# Patient Record
Sex: Female | Born: 1945 | Race: White | Hispanic: No | Marital: Married | Smoking: Never smoker
Health system: Southern US, Community
[De-identification: ages and names within clinical notes are randomized; demographics above are authoritative.]

## PROBLEM LIST (undated history)

## (undated) DIAGNOSIS — M1990 Unspecified osteoarthritis, unspecified site: Secondary | ICD-10-CM

## (undated) DIAGNOSIS — I10 Essential (primary) hypertension: Secondary | ICD-10-CM

## (undated) DIAGNOSIS — A64 Unspecified sexually transmitted disease: Secondary | ICD-10-CM

## (undated) HISTORY — PX: TUBAL LIGATION: SHX77

## (undated) HISTORY — DX: Unspecified sexually transmitted disease: A64

## (undated) HISTORY — DX: Unspecified osteoarthritis, unspecified site: M19.90

## (undated) HISTORY — DX: Essential (primary) hypertension: I10

---

## 1985-02-03 HISTORY — PX: BREAST EXCISIONAL BIOPSY: SUR124

## 1985-02-03 HISTORY — PX: BREAST SURGERY: SHX581

## 1989-02-03 HISTORY — PX: THYROIDECTOMY: SHX17

## 1995-02-04 HISTORY — PX: OTHER SURGICAL HISTORY: SHX169

## 1997-05-30 ENCOUNTER — Other Ambulatory Visit: Admission: RE | Admit: 1997-05-30 | Discharge: 1997-05-30 | Payer: Self-pay

## 1997-06-09 ENCOUNTER — Ambulatory Visit (HOSPITAL_COMMUNITY): Admission: RE | Admit: 1997-06-09 | Discharge: 1997-06-09 | Payer: Self-pay | Admitting: Gastroenterology

## 1997-11-13 ENCOUNTER — Other Ambulatory Visit: Admission: RE | Admit: 1997-11-13 | Discharge: 1997-11-13 | Payer: Self-pay | Admitting: Obstetrics and Gynecology

## 1997-11-21 ENCOUNTER — Ambulatory Visit (HOSPITAL_COMMUNITY): Admission: RE | Admit: 1997-11-21 | Discharge: 1997-11-21 | Payer: Self-pay | Admitting: Obstetrics and Gynecology

## 1998-11-20 ENCOUNTER — Other Ambulatory Visit: Admission: RE | Admit: 1998-11-20 | Discharge: 1998-11-20 | Payer: Self-pay | Admitting: Obstetrics and Gynecology

## 1999-01-07 ENCOUNTER — Encounter: Payer: Self-pay | Admitting: Obstetrics and Gynecology

## 1999-01-07 ENCOUNTER — Ambulatory Visit (HOSPITAL_COMMUNITY): Admission: RE | Admit: 1999-01-07 | Discharge: 1999-01-07 | Payer: Self-pay | Admitting: Obstetrics and Gynecology

## 1999-02-26 ENCOUNTER — Emergency Department (HOSPITAL_COMMUNITY): Admission: EM | Admit: 1999-02-26 | Discharge: 1999-02-26 | Payer: Self-pay | Admitting: Internal Medicine

## 1999-11-20 ENCOUNTER — Other Ambulatory Visit: Admission: RE | Admit: 1999-11-20 | Discharge: 1999-11-20 | Payer: Self-pay | Admitting: Obstetrics and Gynecology

## 2000-01-14 ENCOUNTER — Encounter: Payer: Self-pay | Admitting: Obstetrics and Gynecology

## 2000-01-14 ENCOUNTER — Ambulatory Visit (HOSPITAL_COMMUNITY): Admission: RE | Admit: 2000-01-14 | Discharge: 2000-01-14 | Payer: Self-pay | Admitting: Obstetrics and Gynecology

## 2000-05-19 ENCOUNTER — Ambulatory Visit (HOSPITAL_COMMUNITY): Admission: RE | Admit: 2000-05-19 | Discharge: 2000-05-19 | Payer: Self-pay | Admitting: Gastroenterology

## 2000-05-19 ENCOUNTER — Encounter: Admission: RE | Admit: 2000-05-19 | Discharge: 2000-05-19 | Payer: Self-pay | Admitting: Gastroenterology

## 2000-05-19 ENCOUNTER — Encounter: Payer: Self-pay | Admitting: Gastroenterology

## 2000-11-25 ENCOUNTER — Other Ambulatory Visit: Admission: RE | Admit: 2000-11-25 | Discharge: 2000-11-25 | Payer: Self-pay | Admitting: Obstetrics and Gynecology

## 2001-01-21 ENCOUNTER — Ambulatory Visit (HOSPITAL_COMMUNITY): Admission: RE | Admit: 2001-01-21 | Discharge: 2001-01-21 | Payer: Self-pay | Admitting: Obstetrics and Gynecology

## 2001-01-21 ENCOUNTER — Encounter: Payer: Self-pay | Admitting: Obstetrics and Gynecology

## 2002-01-10 ENCOUNTER — Other Ambulatory Visit: Admission: RE | Admit: 2002-01-10 | Discharge: 2002-01-10 | Payer: Self-pay | Admitting: Obstetrics and Gynecology

## 2002-01-24 ENCOUNTER — Encounter: Admission: RE | Admit: 2002-01-24 | Discharge: 2002-01-24 | Payer: Self-pay | Admitting: Obstetrics and Gynecology

## 2002-01-24 ENCOUNTER — Encounter: Payer: Self-pay | Admitting: Obstetrics and Gynecology

## 2003-02-10 ENCOUNTER — Ambulatory Visit (HOSPITAL_COMMUNITY): Admission: RE | Admit: 2003-02-10 | Discharge: 2003-02-10 | Payer: Self-pay | Admitting: Obstetrics and Gynecology

## 2003-11-01 ENCOUNTER — Other Ambulatory Visit: Admission: RE | Admit: 2003-11-01 | Discharge: 2003-11-01 | Payer: Self-pay | Admitting: Obstetrics and Gynecology

## 2004-02-23 ENCOUNTER — Encounter: Admission: RE | Admit: 2004-02-23 | Discharge: 2004-02-23 | Payer: Self-pay | Admitting: Obstetrics and Gynecology

## 2005-02-03 HISTORY — PX: BLADDER REPAIR: SHX76

## 2005-03-11 ENCOUNTER — Encounter: Admission: RE | Admit: 2005-03-11 | Discharge: 2005-03-11 | Payer: Self-pay | Admitting: Obstetrics and Gynecology

## 2005-03-21 ENCOUNTER — Other Ambulatory Visit: Admission: RE | Admit: 2005-03-21 | Discharge: 2005-03-21 | Payer: Self-pay | Admitting: Obstetrics and Gynecology

## 2005-07-01 ENCOUNTER — Ambulatory Visit (HOSPITAL_BASED_OUTPATIENT_CLINIC_OR_DEPARTMENT_OTHER): Admission: RE | Admit: 2005-07-01 | Discharge: 2005-07-01 | Payer: Self-pay | Admitting: Urology

## 2006-03-18 ENCOUNTER — Encounter: Admission: RE | Admit: 2006-03-18 | Discharge: 2006-03-18 | Payer: Self-pay | Admitting: Obstetrics and Gynecology

## 2006-03-27 ENCOUNTER — Other Ambulatory Visit: Admission: RE | Admit: 2006-03-27 | Discharge: 2006-03-27 | Payer: Self-pay | Admitting: Obstetrics and Gynecology

## 2006-05-07 ENCOUNTER — Encounter: Admission: RE | Admit: 2006-05-07 | Discharge: 2006-05-07 | Payer: Self-pay | Admitting: Obstetrics and Gynecology

## 2007-05-06 ENCOUNTER — Encounter: Admission: RE | Admit: 2007-05-06 | Discharge: 2007-05-06 | Payer: Self-pay | Admitting: Obstetrics and Gynecology

## 2008-05-23 ENCOUNTER — Encounter: Admission: RE | Admit: 2008-05-23 | Discharge: 2008-05-23 | Payer: Self-pay | Admitting: Obstetrics and Gynecology

## 2009-05-24 ENCOUNTER — Encounter: Admission: RE | Admit: 2009-05-24 | Discharge: 2009-05-24 | Payer: Self-pay | Admitting: Obstetrics and Gynecology

## 2010-05-01 ENCOUNTER — Other Ambulatory Visit: Payer: Self-pay | Admitting: Obstetrics and Gynecology

## 2010-05-01 DIAGNOSIS — Z1231 Encounter for screening mammogram for malignant neoplasm of breast: Secondary | ICD-10-CM

## 2010-05-29 ENCOUNTER — Other Ambulatory Visit: Payer: Self-pay | Admitting: Obstetrics and Gynecology

## 2010-05-29 DIAGNOSIS — Z1231 Encounter for screening mammogram for malignant neoplasm of breast: Secondary | ICD-10-CM

## 2010-05-30 ENCOUNTER — Ambulatory Visit (HOSPITAL_COMMUNITY): Payer: 59

## 2010-06-07 ENCOUNTER — Ambulatory Visit
Admission: RE | Admit: 2010-06-07 | Discharge: 2010-06-07 | Disposition: A | Discharge status: Home / Self Care | Payer: 59 | Source: Ambulatory Visit | Attending: Obstetrics and Gynecology | Admitting: Obstetrics and Gynecology

## 2010-06-07 DIAGNOSIS — Z1231 Encounter for screening mammogram for malignant neoplasm of breast: Secondary | ICD-10-CM

## 2010-06-21 NOTE — Procedures (Signed)
[place]  Patient:    Miranda Holland, Miranda Holland                      MRN: [id number] Proc. Date: 05/19/00 Adm. Date:  [id number] Attending:  Rich Brave CC:         Edwena Felty. Ashley Royalty, M.D.   Procedure Report  PROCEDURE PERFORMED:  Colonoscopy (incomplete).  ENDOSCOPIST:  Florencia Reasons, M.D.  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 At this time colonoscopy was incomplete, apparently due to severe redundancy or tortuosity of the colon, which prevented reaching the cecum despite prolonged efforts.  FINDINGS:  Again, an incomplete exam despite prolonged attempts.  Normal, however, up to the level of the insertion of the scope.  DESCRIPTION OF PROCEDURE:  The nature, purpose and risks of the procedure had been discussed with the patient, who provided written consent.  Sedation prior to the procedure was fentanyl 100 mcg and Versed 10 mg IV without arrhythmias or desaturation.  The procedure was initiated with the Olympus pediatric adjustable tension video colonoscope and this was inserted for most of its length, probably about 70 to 100 cm, taking out loops as I advanced.  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  Despite having the patient in various body positions including supine and right lateral decubitus positions, and then back in left lateral decubitus position, I was not able to get the scope to advance farther, so it was removed, examining during pull-back and not noting any recurrent polyps or evidence of diverticular disease, colitis or vascular  malformations. Retroflexion in the rectum was normal.  The patient was then recolonoscoped using the Olympus adult video colonoscope, but again, basically the same problem was encountered.  At this time it was felt that it was most prudent to abandon further attempts at colonoscoping this patient and instead, use barium contrast radiography on a semi-elective basis, perhaps later today or else tomorrow after the patient has had a chance to expel air, to look for polyps in the more proximal sections of the colon.  The patient tolerated the procedure well and there were no apparent complications.  IMPRESSION: 1. Unable to reach the cecum, apparently due to marked redundancy and/or    tortuosity of the colon. 2. Normal exam up to the limit of todays study. 3. 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  PLAN:  As noted above, I will offer the patient a barium enema later today or else tomorrow. DD:  05/19/00 TD:  05/19/00 Job: [id number]

## 2010-06-21 NOTE — Op Note (Signed)
Miranda Holland, Miranda Holland               ACCOUNT NO.:  [id number]   MEDICAL RECORD NO.:  [id number]          PATIENT TYPE:  AMB   LOCATION:  [facility]                         FACILITY:  [facility]   PHYSICIAN:  Martina Sinner, MD DATE OF BIRTH:  08-11-1945   DATE OF PROCEDURE:  07/01/2005  DATE OF DISCHARGE:                                 OPERATIVE REPORT   PREOPERATIVE DIAGNOSIS:  Stress incontinence.   POSTOPERATIVE DIAGNOSIS:  Stress incontinence.   SURGERY:  1.  SPARC sling cystourethropexy.  2.  Cystoscopy.   Miranda Holland has stress urinary incontinence.  She consented to a sling  cystourethropexy.   The patient was prepped and draped in the usual fashion.  She is given  preoperative antibiotics.  Extra care was taken to minimize the risk of  compartment syndrome, neuropathy and DVT when positioned her legs.   Two 1 cm incisions were made 1.5 cm lateral to the midline and one  fingerbreadth cephalad to this pubic bone in the lower abdomen.  Vaginal  exposure was made using a Kerr cerebellar weighted vaginal speculum and silk  labial sutures.  A 2.5 cm incision was made over the midline with a thick  pubocervical flap.  I next dissected laterally with the Metzenbaums to the  pubic bone.  I could feel the pubic bone bilaterally with the Foley catheter  in the urethra in the midline.   With the bladder empty I passed the two [facility] needles through the suprapubic  space using my usual technique, staying on the back of the pubic bone and  parallel to the midline.  I passed them over the pulp of my finger at the  urethrovesical angle, delivering them into the vagina.  I passed the [facility]  bilaterally.  I brought up the [facility] with its sheath through the retrograde  space.  I tensioned utilizing my usual technique.  I cut below the blue  dots, passed irrigant through the sheath and removed the sheaths bilaterally  giving counter traction with my Metzenbaums.  I could hypermobilize the  sling medial as well as lateral to the midline.  I could rotate my  instrument easily 360 degrees.  The sling was at the mid urethra.   Hemostasis was good.  I closed the vaginal incision with running 2-0 Vicryl  on a CT1.  I used my usual two interrupted 2-0 Vicryl sutures as  reinforcement.  I closed the skin with subcuticular 4-0 Vicryl.  I used  irrigation with antibiotic.   Total blood loss was approximately 50 mL.  The procedure was well-tolerated.  The Foley catheter was draining blue dye at the end of the case.   I cystoscoped the patient during the case and there was no injury or trocar  within the bladder or urethra.  There was no movement on the lateral  sidewalls with movement of the trocars.  There was efflux of indigo carmine  bilaterally.           ______________________________  Martina Sinner, MD  Electronically Signed     SAM/MEDQ  D:  07/01/2005  T:  07/01/2005  Job:  [id number]

## 2011-02-07 DIAGNOSIS — Z09 Encounter for follow-up examination after completed treatment for conditions other than malignant neoplasm: Secondary | ICD-10-CM | POA: Diagnosis not present

## 2011-02-07 DIAGNOSIS — Z86010 Personal history of colonic polyps: Secondary | ICD-10-CM | POA: Diagnosis not present

## 2011-02-07 DIAGNOSIS — R198 Other specified symptoms and signs involving the digestive system and abdomen: Secondary | ICD-10-CM | POA: Diagnosis not present

## 2011-05-21 ENCOUNTER — Other Ambulatory Visit: Payer: Self-pay | Admitting: Obstetrics and Gynecology

## 2011-05-21 DIAGNOSIS — Z1231 Encounter for screening mammogram for malignant neoplasm of breast: Secondary | ICD-10-CM

## 2011-05-21 DIAGNOSIS — M8580 Other specified disorders of bone density and structure, unspecified site: Secondary | ICD-10-CM

## 2011-06-16 ENCOUNTER — Ambulatory Visit
Admission: RE | Admit: 2011-06-16 | Discharge: 2011-06-16 | Disposition: A | Discharge status: Home / Self Care | Payer: 59 | Source: Ambulatory Visit | Attending: Obstetrics and Gynecology | Admitting: Obstetrics and Gynecology

## 2011-06-16 DIAGNOSIS — M8580 Other specified disorders of bone density and structure, unspecified site: Secondary | ICD-10-CM

## 2011-06-16 DIAGNOSIS — Z1231 Encounter for screening mammogram for malignant neoplasm of breast: Secondary | ICD-10-CM

## 2011-06-16 DIAGNOSIS — Z780 Asymptomatic menopausal state: Secondary | ICD-10-CM | POA: Diagnosis not present

## 2011-06-16 DIAGNOSIS — Z13820 Encounter for screening for osteoporosis: Secondary | ICD-10-CM | POA: Diagnosis not present

## 2011-07-01 DIAGNOSIS — Z124 Encounter for screening for malignant neoplasm of cervix: Secondary | ICD-10-CM | POA: Diagnosis not present

## 2011-07-01 DIAGNOSIS — Z01419 Encounter for gynecological examination (general) (routine) without abnormal findings: Secondary | ICD-10-CM | POA: Diagnosis not present

## 2011-07-23 DIAGNOSIS — H9209 Otalgia, unspecified ear: Secondary | ICD-10-CM | POA: Diagnosis not present

## 2011-10-13 DIAGNOSIS — M2020 Hallux rigidus, unspecified foot: Secondary | ICD-10-CM | POA: Diagnosis not present

## 2011-11-06 DIAGNOSIS — Z0000 Encounter for general adult medical examination without abnormal findings: Secondary | ICD-10-CM | POA: Diagnosis not present

## 2011-11-06 DIAGNOSIS — Z131 Encounter for screening for diabetes mellitus: Secondary | ICD-10-CM | POA: Diagnosis not present

## 2011-11-06 DIAGNOSIS — Z136 Encounter for screening for cardiovascular disorders: Secondary | ICD-10-CM | POA: Diagnosis not present

## 2011-11-06 DIAGNOSIS — Z23 Encounter for immunization: Secondary | ICD-10-CM | POA: Diagnosis not present

## 2011-11-06 DIAGNOSIS — R030 Elevated blood-pressure reading, without diagnosis of hypertension: Secondary | ICD-10-CM | POA: Diagnosis not present

## 2011-11-06 DIAGNOSIS — Z1331 Encounter for screening for depression: Secondary | ICD-10-CM | POA: Diagnosis not present

## 2011-11-13 DIAGNOSIS — Z136 Encounter for screening for cardiovascular disorders: Secondary | ICD-10-CM | POA: Diagnosis not present

## 2011-11-13 DIAGNOSIS — Z131 Encounter for screening for diabetes mellitus: Secondary | ICD-10-CM | POA: Diagnosis not present

## 2012-01-04 HISTORY — PX: TOE FUSION: SHX1070

## 2012-01-06 DIAGNOSIS — G8918 Other acute postprocedural pain: Secondary | ICD-10-CM | POA: Diagnosis not present

## 2012-01-06 DIAGNOSIS — M2020 Hallux rigidus, unspecified foot: Secondary | ICD-10-CM | POA: Diagnosis not present

## 2012-02-16 DIAGNOSIS — Z4789 Encounter for other orthopedic aftercare: Secondary | ICD-10-CM | POA: Diagnosis not present

## 2012-03-16 DIAGNOSIS — Z4789 Encounter for other orthopedic aftercare: Secondary | ICD-10-CM | POA: Diagnosis not present

## 2012-06-18 ENCOUNTER — Telehealth: Payer: Self-pay | Admitting: Nurse Practitioner

## 2012-06-18 ENCOUNTER — Other Ambulatory Visit: Payer: Self-pay

## 2012-06-18 DIAGNOSIS — Z1231 Encounter for screening mammogram for malignant neoplasm of breast: Secondary | ICD-10-CM

## 2012-06-18 NOTE — Telephone Encounter (Signed)
Patient is a former employee who called with private questions for triage.

## 2012-06-18 NOTE — Telephone Encounter (Signed)
Questions about AEX and scheduling. Last annual 06-11-11. Appt scheduled for 06-22-12 with Dr Tresa Res

## 2012-06-22 ENCOUNTER — Ambulatory Visit (INDEPENDENT_AMBULATORY_CARE_PROVIDER_SITE_OTHER): Payer: Medicare Other | Admitting: Obstetrics and Gynecology

## 2012-06-22 ENCOUNTER — Encounter: Payer: Self-pay | Admitting: Obstetrics and Gynecology

## 2012-06-22 VITALS — BP 140/80 | Ht 62.0 in | Wt 122.0 lb

## 2012-06-22 DIAGNOSIS — Z01419 Encounter for gynecological examination (general) (routine) without abnormal findings: Secondary | ICD-10-CM | POA: Diagnosis not present

## 2012-06-22 MED ORDER — PANTOPRAZOLE SODIUM 40 MG PO TBEC: 40.0000 mg | DELAYED_RELEASE_TABLET | Freq: Every day | ORAL | Status: DC

## 2012-06-22 MED ORDER — ALPRAZOLAM 0.25 MG PO TABS: 0.2500 mg | ORAL_TABLET | ORAL | Status: DC | PRN

## 2012-06-22 MED ORDER — VALACYCLOVIR HCL 1 G PO TABS: 1000.0000 mg | ORAL_TABLET | Freq: Two times a day (BID) | ORAL | Status: AC

## 2012-06-22 NOTE — Patient Instructions (Signed)

## 2012-06-22 NOTE — Progress Notes (Addendum)
67 y.o.   Married    Caucasian   female   G2P2   here for annual exam.  Had foot surgery for arthritis in left foot, and needs right one done, but she was in a boot for 6 weeks and dreads it.  No bladder sx's.  Was told her bladder repair would last 7 years and it has been 7 years, but she is not noticing worsening of sx's.  No LMP recorded. Patient is postmenopausal.         Sexually active: yes  The current method of family planning is post menopausal status, BTSP   Exercising: walking daily, golf 2-3 days a week Last mammogram 06/16/11 neg, appt 07/08/12 Last pap smear:06/18/09 neg History of abnormal pap: never Smoking: quit 50 years Alcohol: 1 glass of wine daily Last colonoscopy:06/2011 normal , repeat in 7-10 years Last Bone Density:  06/16/11 osteopenia Last tetanus shot: 2009 Last cholesterol check: 2013 normal  Hgb:     pcp           Urine:pcp   Family History  Problem Relation Age of Onset  . Diabetes Mother   . Heart disease Mother   . Cirrhosis Father   . Hypertension Sister   . Heart disease Brother     There are no active problems to display for this patient.   Past Medical History  Diagnosis Date  . STD (sexually transmitted disease)     HSV 1  . Hypertension   . Arthritis     OSTEO    Past Surgical History  Procedure Laterality Date  . Tubal ligation    . Breast surgery  1987    Lt Breast Bx Benign  .  submucous myoma, resection  1997  . Thyroidectomy  1991    hemi(left)  . Bladder repair  2007    with mesh  . Toe fusion  01/2012    LEFT Big Toe    Allergies: Review of patient's allergies indicates no known allergies.  Current Outpatient Prescriptions  Medication Sig Dispense Refill  . ALPRAZolam (XANAX) 0.25 MG tablet Take 0.25 mg by mouth as needed for sleep.      Marland Kitchen ibuprofen (ADVIL,MOTRIN) 200 MG tablet Take 200 mg by mouth every 6 (six) hours as needed for pain.      . Multiple Vitamin (MULTI-VITAMIN PO) Take by mouth daily.      .  pantoprazole (PROTONIX) 40 MG tablet       . valACYclovir (VALTREX) 1000 MG tablet Take 1,000 mg by mouth 2 (two) times daily.       No current facility-administered medications for this visit.  Uses valtrex for cold sores  ROS: Pertinent items are noted in HPI.  Social Hx:  Married, retired Engineer, civil (consulting), two daughters  Exam:    BP 140/80  Ht 5\' 2"  (1.575 m)  Wt 122 lb (55.339 kg)  BMI 22.31 kg/m2   Wt Readings from Last 3 Encounters:  06/22/12 122 lb (55.339 kg)     Ht Readings from Last 3 Encounters:  06/22/12 5\' 2"  (1.575 m)    General appearance: alert, cooperative and appears stated age Head: Normocephalic, without obvious abnormality, atraumatic Neck: no adenopathy, supple, symmetrical, trachea midline and thyroid not enlarged, symmetric, no tenderness/mass/nodules Lungs: clear to auscultation bilaterally Breasts: Inspection negative, No nipple retraction or dimpling, No nipple discharge or bleeding, No axillary or supraclavicular adenopathy, Normal to palpation without dominant masses Heart: regular rate and rhythm Abdomen: soft, non-tender; bowel sounds normal;  no masses,  no organomegaly Extremities: extremities normal, atraumatic, no cyanosis or edema Skin: Skin color, texture, turgor normal. No rashes or lesions Lymph nodes: Cervical, supraclavicular, and axillary nodes normal. No abnormal inguinal nodes palpated Neurologic: Grossly normal   Pelvic: External genitalia:  no lesions              Urethra:  normal appearing urethra with no masses, tenderness or lesions              Bartholins and Skenes: normal                 Vagina: normal appearing vagina with normal color and discharge, no lesions              Cervix: normal appearance              Pap taken: no     Bimanual Exam:  Uterus:  uterus is normal size, shape, consistency and nontender,ant,mobile                                      Adnexa: normal adnexa in size, nontender and no masses                                       Rectovaginal: Confirms                                      Anus:  normal sphincter tone, no lesions  A: normal menopausal exam, no HRT     S/p mesh badder surgery     P: mammogram counseled on breast self exam, mammography screening, osteoporosis, adequate intake of calcium and vitamin D, diet and exercise return annually or prn     An After Visit Summary was printed and given to the patient.

## 2012-07-08 ENCOUNTER — Ambulatory Visit: Payer: Medicare Other

## 2012-07-09 ENCOUNTER — Ambulatory Visit
Admission: RE | Admit: 2012-07-09 | Discharge: 2012-07-09 | Disposition: A | Discharge status: Home / Self Care | Payer: Medicare Other | Source: Ambulatory Visit

## 2012-07-09 DIAGNOSIS — Z1231 Encounter for screening mammogram for malignant neoplasm of breast: Secondary | ICD-10-CM

## 2012-09-01 DIAGNOSIS — H43819 Vitreous degeneration, unspecified eye: Secondary | ICD-10-CM | POA: Diagnosis not present

## 2012-10-06 DIAGNOSIS — H43399 Other vitreous opacities, unspecified eye: Secondary | ICD-10-CM | POA: Diagnosis not present

## 2012-11-09 DIAGNOSIS — Z0000 Encounter for general adult medical examination without abnormal findings: Secondary | ICD-10-CM | POA: Diagnosis not present

## 2012-11-17 DIAGNOSIS — Z23 Encounter for immunization: Secondary | ICD-10-CM | POA: Diagnosis not present

## 2013-07-08 ENCOUNTER — Other Ambulatory Visit: Payer: Self-pay

## 2013-07-08 DIAGNOSIS — Z1231 Encounter for screening mammogram for malignant neoplasm of breast: Secondary | ICD-10-CM

## 2013-07-15 ENCOUNTER — Ambulatory Visit: Payer: Medicare Other | Admitting: Obstetrics and Gynecology

## 2013-07-19 ENCOUNTER — Ambulatory Visit
Admission: RE | Admit: 2013-07-19 | Discharge: 2013-07-19 | Disposition: A | Discharge status: Home / Self Care | Payer: Medicare Other | Source: Ambulatory Visit

## 2013-07-19 DIAGNOSIS — Z1231 Encounter for screening mammogram for malignant neoplasm of breast: Secondary | ICD-10-CM | POA: Diagnosis not present

## 2013-12-05 ENCOUNTER — Encounter: Payer: Self-pay | Admitting: Obstetrics and Gynecology

## 2013-12-27 DIAGNOSIS — Z1389 Encounter for screening for other disorder: Secondary | ICD-10-CM | POA: Diagnosis not present

## 2013-12-27 DIAGNOSIS — Z0000 Encounter for general adult medical examination without abnormal findings: Secondary | ICD-10-CM | POA: Diagnosis not present

## 2013-12-27 DIAGNOSIS — Z23 Encounter for immunization: Secondary | ICD-10-CM | POA: Diagnosis not present

## 2014-01-17 DIAGNOSIS — Z136 Encounter for screening for cardiovascular disorders: Secondary | ICD-10-CM | POA: Diagnosis not present

## 2014-01-17 DIAGNOSIS — E041 Nontoxic single thyroid nodule: Secondary | ICD-10-CM | POA: Diagnosis not present

## 2014-01-17 DIAGNOSIS — Z0000 Encounter for general adult medical examination without abnormal findings: Secondary | ICD-10-CM | POA: Diagnosis not present

## 2014-02-09 DIAGNOSIS — L729 Follicular cyst of the skin and subcutaneous tissue, unspecified: Secondary | ICD-10-CM | POA: Diagnosis not present

## 2014-02-22 DIAGNOSIS — R2232 Localized swelling, mass and lump, left upper limb: Secondary | ICD-10-CM | POA: Diagnosis not present

## 2014-07-11 ENCOUNTER — Other Ambulatory Visit: Payer: Self-pay

## 2014-07-11 DIAGNOSIS — Z1231 Encounter for screening mammogram for malignant neoplasm of breast: Secondary | ICD-10-CM

## 2014-08-10 ENCOUNTER — Ambulatory Visit
Admission: RE | Admit: 2014-08-10 | Discharge: 2014-08-10 | Disposition: A | Discharge status: Home / Self Care | Payer: Medicare Other | Source: Ambulatory Visit

## 2014-08-10 DIAGNOSIS — Z1231 Encounter for screening mammogram for malignant neoplasm of breast: Secondary | ICD-10-CM

## 2014-11-07 DIAGNOSIS — H2513 Age-related nuclear cataract, bilateral: Secondary | ICD-10-CM | POA: Diagnosis not present

## 2014-11-07 DIAGNOSIS — H04123 Dry eye syndrome of bilateral lacrimal glands: Secondary | ICD-10-CM | POA: Diagnosis not present

## 2015-01-09 DIAGNOSIS — I341 Nonrheumatic mitral (valve) prolapse: Secondary | ICD-10-CM | POA: Diagnosis not present

## 2015-01-09 DIAGNOSIS — Z0000 Encounter for general adult medical examination without abnormal findings: Secondary | ICD-10-CM | POA: Diagnosis not present

## 2015-01-09 DIAGNOSIS — M8588 Other specified disorders of bone density and structure, other site: Secondary | ICD-10-CM | POA: Diagnosis not present

## 2015-01-09 DIAGNOSIS — E890 Postprocedural hypothyroidism: Secondary | ICD-10-CM | POA: Diagnosis not present

## 2015-01-09 DIAGNOSIS — F418 Other specified anxiety disorders: Secondary | ICD-10-CM | POA: Diagnosis not present

## 2015-01-09 DIAGNOSIS — Z1389 Encounter for screening for other disorder: Secondary | ICD-10-CM | POA: Diagnosis not present

## 2015-01-11 DIAGNOSIS — E890 Postprocedural hypothyroidism: Secondary | ICD-10-CM | POA: Diagnosis not present

## 2015-01-11 DIAGNOSIS — R42 Dizziness and giddiness: Secondary | ICD-10-CM | POA: Diagnosis not present

## 2015-01-11 DIAGNOSIS — Z0000 Encounter for general adult medical examination without abnormal findings: Secondary | ICD-10-CM | POA: Diagnosis not present

## 2015-01-11 DIAGNOSIS — I341 Nonrheumatic mitral (valve) prolapse: Secondary | ICD-10-CM | POA: Diagnosis not present

## 2015-07-04 ENCOUNTER — Other Ambulatory Visit: Payer: Self-pay | Admitting: Internal Medicine

## 2015-07-09 ENCOUNTER — Other Ambulatory Visit: Payer: Self-pay | Admitting: Internal Medicine

## 2015-07-09 DIAGNOSIS — M8580 Other specified disorders of bone density and structure, unspecified site: Secondary | ICD-10-CM

## 2015-07-09 DIAGNOSIS — Z1231 Encounter for screening mammogram for malignant neoplasm of breast: Secondary | ICD-10-CM

## 2015-08-15 ENCOUNTER — Ambulatory Visit
Admission: RE | Admit: 2015-08-15 | Discharge: 2015-08-15 | Disposition: A | Discharge status: Home / Self Care | Payer: Medicare Other | Source: Ambulatory Visit | Attending: Internal Medicine | Admitting: Internal Medicine

## 2015-08-15 ENCOUNTER — Ambulatory Visit: Payer: Medicare Other

## 2015-08-15 ENCOUNTER — Other Ambulatory Visit: Payer: Medicare Other

## 2015-08-15 DIAGNOSIS — Z780 Asymptomatic menopausal state: Secondary | ICD-10-CM | POA: Diagnosis not present

## 2015-08-15 DIAGNOSIS — Z1231 Encounter for screening mammogram for malignant neoplasm of breast: Secondary | ICD-10-CM

## 2015-08-15 DIAGNOSIS — M8589 Other specified disorders of bone density and structure, multiple sites: Secondary | ICD-10-CM | POA: Diagnosis not present

## 2015-08-15 DIAGNOSIS — M8580 Other specified disorders of bone density and structure, unspecified site: Secondary | ICD-10-CM

## 2015-10-19 DIAGNOSIS — H1045 Other chronic allergic conjunctivitis: Secondary | ICD-10-CM | POA: Diagnosis not present

## 2015-11-05 DIAGNOSIS — Z23 Encounter for immunization: Secondary | ICD-10-CM | POA: Diagnosis not present

## 2016-02-27 ENCOUNTER — Other Ambulatory Visit: Payer: Self-pay | Admitting: Internal Medicine

## 2016-02-27 ENCOUNTER — Other Ambulatory Visit (HOSPITAL_COMMUNITY)
Admission: RE | Admit: 2016-02-27 | Discharge: 2016-02-27 | Disposition: A | Discharge status: Home / Self Care | Payer: Medicare Other | Source: Ambulatory Visit | Attending: Internal Medicine | Admitting: Internal Medicine

## 2016-02-27 DIAGNOSIS — Z1151 Encounter for screening for human papillomavirus (HPV): Secondary | ICD-10-CM | POA: Diagnosis not present

## 2016-02-27 DIAGNOSIS — M152 Bouchard's nodes (with arthropathy): Secondary | ICD-10-CM | POA: Diagnosis not present

## 2016-02-27 DIAGNOSIS — E785 Hyperlipidemia, unspecified: Secondary | ICD-10-CM | POA: Diagnosis not present

## 2016-02-27 DIAGNOSIS — E890 Postprocedural hypothyroidism: Secondary | ICD-10-CM | POA: Diagnosis not present

## 2016-02-27 DIAGNOSIS — I341 Nonrheumatic mitral (valve) prolapse: Secondary | ICD-10-CM | POA: Diagnosis not present

## 2016-02-27 DIAGNOSIS — Z1211 Encounter for screening for malignant neoplasm of colon: Secondary | ICD-10-CM | POA: Diagnosis not present

## 2016-02-27 DIAGNOSIS — Z1389 Encounter for screening for other disorder: Secondary | ICD-10-CM | POA: Diagnosis not present

## 2016-02-27 DIAGNOSIS — Z124 Encounter for screening for malignant neoplasm of cervix: Secondary | ICD-10-CM | POA: Diagnosis not present

## 2016-02-27 DIAGNOSIS — Z01419 Encounter for gynecological examination (general) (routine) without abnormal findings: Secondary | ICD-10-CM | POA: Diagnosis not present

## 2016-02-27 DIAGNOSIS — Z0000 Encounter for general adult medical examination without abnormal findings: Secondary | ICD-10-CM | POA: Diagnosis not present

## 2016-02-27 DIAGNOSIS — F418 Other specified anxiety disorders: Secondary | ICD-10-CM | POA: Diagnosis not present

## 2016-02-27 DIAGNOSIS — K219 Gastro-esophageal reflux disease without esophagitis: Secondary | ICD-10-CM | POA: Diagnosis not present

## 2016-02-27 DIAGNOSIS — B009 Herpesviral infection, unspecified: Secondary | ICD-10-CM | POA: Diagnosis not present

## 2016-02-27 DIAGNOSIS — D126 Benign neoplasm of colon, unspecified: Secondary | ICD-10-CM | POA: Diagnosis not present

## 2016-02-28 DIAGNOSIS — K219 Gastro-esophageal reflux disease without esophagitis: Secondary | ICD-10-CM | POA: Diagnosis not present

## 2016-02-28 DIAGNOSIS — I341 Nonrheumatic mitral (valve) prolapse: Secondary | ICD-10-CM | POA: Diagnosis not present

## 2016-02-28 DIAGNOSIS — E785 Hyperlipidemia, unspecified: Secondary | ICD-10-CM | POA: Diagnosis not present

## 2016-02-28 DIAGNOSIS — E890 Postprocedural hypothyroidism: Secondary | ICD-10-CM | POA: Diagnosis not present

## 2016-03-04 LAB — CYTOLOGY - PAP
Diagnosis: NEGATIVE
HPV: NOT DETECTED

## 2016-09-16 ENCOUNTER — Other Ambulatory Visit: Payer: Self-pay | Admitting: Internal Medicine

## 2016-09-16 DIAGNOSIS — Z1231 Encounter for screening mammogram for malignant neoplasm of breast: Secondary | ICD-10-CM

## 2016-09-19 DIAGNOSIS — R42 Dizziness and giddiness: Secondary | ICD-10-CM | POA: Diagnosis not present

## 2016-09-19 DIAGNOSIS — J329 Chronic sinusitis, unspecified: Secondary | ICD-10-CM | POA: Diagnosis not present

## 2016-09-19 DIAGNOSIS — H6983 Other specified disorders of Eustachian tube, bilateral: Secondary | ICD-10-CM | POA: Diagnosis not present

## 2016-10-01 ENCOUNTER — Ambulatory Visit
Admission: RE | Admit: 2016-10-01 | Discharge: 2016-10-01 | Disposition: A | Discharge status: Home / Self Care | Payer: PRIVATE HEALTH INSURANCE | Source: Ambulatory Visit | Attending: Internal Medicine | Admitting: Internal Medicine

## 2016-10-01 ENCOUNTER — Other Ambulatory Visit: Payer: Self-pay | Admitting: Internal Medicine

## 2016-10-01 DIAGNOSIS — Z1231 Encounter for screening mammogram for malignant neoplasm of breast: Secondary | ICD-10-CM | POA: Diagnosis not present

## 2016-10-02 ENCOUNTER — Other Ambulatory Visit: Payer: Self-pay | Admitting: Internal Medicine

## 2016-10-02 DIAGNOSIS — R928 Other abnormal and inconclusive findings on diagnostic imaging of breast: Secondary | ICD-10-CM

## 2016-10-08 ENCOUNTER — Ambulatory Visit: Payer: PRIVATE HEALTH INSURANCE

## 2016-10-08 ENCOUNTER — Ambulatory Visit
Admission: RE | Admit: 2016-10-08 | Discharge: 2016-10-08 | Disposition: A | Discharge status: Home / Self Care | Payer: Medicare Other | Source: Ambulatory Visit | Attending: Internal Medicine | Admitting: Internal Medicine

## 2016-10-08 DIAGNOSIS — R928 Other abnormal and inconclusive findings on diagnostic imaging of breast: Secondary | ICD-10-CM

## 2016-10-08 DIAGNOSIS — R922 Inconclusive mammogram: Secondary | ICD-10-CM | POA: Diagnosis not present

## 2016-12-02 DIAGNOSIS — H2513 Age-related nuclear cataract, bilateral: Secondary | ICD-10-CM | POA: Diagnosis not present

## 2016-12-02 DIAGNOSIS — H5213 Myopia, bilateral: Secondary | ICD-10-CM | POA: Diagnosis not present

## 2016-12-02 DIAGNOSIS — H524 Presbyopia: Secondary | ICD-10-CM | POA: Diagnosis not present

## 2016-12-02 DIAGNOSIS — H52223 Regular astigmatism, bilateral: Secondary | ICD-10-CM | POA: Diagnosis not present

## 2017-04-28 DIAGNOSIS — E785 Hyperlipidemia, unspecified: Secondary | ICD-10-CM | POA: Diagnosis not present

## 2017-04-28 DIAGNOSIS — E890 Postprocedural hypothyroidism: Secondary | ICD-10-CM | POA: Diagnosis not present

## 2017-04-28 DIAGNOSIS — K219 Gastro-esophageal reflux disease without esophagitis: Secondary | ICD-10-CM | POA: Diagnosis not present

## 2017-04-28 DIAGNOSIS — I341 Nonrheumatic mitral (valve) prolapse: Secondary | ICD-10-CM | POA: Diagnosis not present

## 2017-04-28 DIAGNOSIS — Z0000 Encounter for general adult medical examination without abnormal findings: Secondary | ICD-10-CM | POA: Diagnosis not present

## 2017-04-28 DIAGNOSIS — I499 Cardiac arrhythmia, unspecified: Secondary | ICD-10-CM | POA: Diagnosis not present

## 2017-04-28 DIAGNOSIS — Z1389 Encounter for screening for other disorder: Secondary | ICD-10-CM | POA: Diagnosis not present

## 2017-04-28 DIAGNOSIS — R42 Dizziness and giddiness: Secondary | ICD-10-CM | POA: Diagnosis not present

## 2017-04-28 DIAGNOSIS — F418 Other specified anxiety disorders: Secondary | ICD-10-CM | POA: Diagnosis not present

## 2017-04-29 ENCOUNTER — Other Ambulatory Visit: Payer: Self-pay | Admitting: Internal Medicine

## 2017-04-29 DIAGNOSIS — I341 Nonrheumatic mitral (valve) prolapse: Secondary | ICD-10-CM

## 2017-04-30 ENCOUNTER — Ambulatory Visit (HOSPITAL_COMMUNITY): Payer: Medicare Other | Attending: Cardiology

## 2017-04-30 ENCOUNTER — Other Ambulatory Visit: Payer: Self-pay

## 2017-04-30 DIAGNOSIS — I341 Nonrheumatic mitral (valve) prolapse: Secondary | ICD-10-CM | POA: Insufficient documentation

## 2017-04-30 DIAGNOSIS — I340 Nonrheumatic mitral (valve) insufficiency: Secondary | ICD-10-CM | POA: Diagnosis not present

## 2017-04-30 DIAGNOSIS — M1990 Unspecified osteoarthritis, unspecified site: Secondary | ICD-10-CM | POA: Diagnosis not present

## 2017-04-30 DIAGNOSIS — I10 Essential (primary) hypertension: Secondary | ICD-10-CM | POA: Insufficient documentation

## 2017-05-14 DIAGNOSIS — E785 Hyperlipidemia, unspecified: Secondary | ICD-10-CM | POA: Diagnosis not present

## 2017-05-14 DIAGNOSIS — I341 Nonrheumatic mitral (valve) prolapse: Secondary | ICD-10-CM | POA: Diagnosis not present

## 2017-06-09 DIAGNOSIS — L237 Allergic contact dermatitis due to plants, except food: Secondary | ICD-10-CM | POA: Diagnosis not present

## 2017-08-28 ENCOUNTER — Other Ambulatory Visit: Payer: Self-pay | Admitting: Internal Medicine

## 2017-08-28 DIAGNOSIS — Z1231 Encounter for screening mammogram for malignant neoplasm of breast: Secondary | ICD-10-CM

## 2017-09-02 DIAGNOSIS — L570 Actinic keratosis: Secondary | ICD-10-CM | POA: Diagnosis not present

## 2017-09-02 DIAGNOSIS — D044 Carcinoma in situ of skin of scalp and neck: Secondary | ICD-10-CM | POA: Diagnosis not present

## 2017-09-02 DIAGNOSIS — L821 Other seborrheic keratosis: Secondary | ICD-10-CM | POA: Diagnosis not present

## 2017-09-02 DIAGNOSIS — D225 Melanocytic nevi of trunk: Secondary | ICD-10-CM | POA: Diagnosis not present

## 2017-09-02 DIAGNOSIS — L814 Other melanin hyperpigmentation: Secondary | ICD-10-CM | POA: Diagnosis not present

## 2017-09-21 DIAGNOSIS — L570 Actinic keratosis: Secondary | ICD-10-CM | POA: Diagnosis not present

## 2017-09-21 DIAGNOSIS — D044 Carcinoma in situ of skin of scalp and neck: Secondary | ICD-10-CM | POA: Diagnosis not present

## 2017-09-23 DIAGNOSIS — M2021 Hallux rigidus, right foot: Secondary | ICD-10-CM | POA: Diagnosis not present

## 2017-09-23 DIAGNOSIS — M79672 Pain in left foot: Secondary | ICD-10-CM | POA: Diagnosis not present

## 2017-09-23 DIAGNOSIS — Z978 Presence of other specified devices: Secondary | ICD-10-CM | POA: Diagnosis not present

## 2017-09-23 DIAGNOSIS — M67471 Ganglion, right ankle and foot: Secondary | ICD-10-CM | POA: Diagnosis not present

## 2017-10-06 ENCOUNTER — Inpatient Hospital Stay: Admission: RE | Admit: 2017-10-06 | Payer: Medicare Other | Source: Ambulatory Visit

## 2017-10-07 ENCOUNTER — Ambulatory Visit: Payer: Medicare Other

## 2017-10-12 ENCOUNTER — Ambulatory Visit
Admission: RE | Admit: 2017-10-12 | Discharge: 2017-10-12 | Disposition: A | Discharge status: Home / Self Care | Payer: Medicare Other | Source: Ambulatory Visit | Attending: Internal Medicine | Admitting: Internal Medicine

## 2017-10-12 DIAGNOSIS — Z1231 Encounter for screening mammogram for malignant neoplasm of breast: Secondary | ICD-10-CM

## 2017-10-23 DIAGNOSIS — Z23 Encounter for immunization: Secondary | ICD-10-CM | POA: Diagnosis not present

## 2017-11-11 DIAGNOSIS — R1013 Epigastric pain: Secondary | ICD-10-CM | POA: Diagnosis not present

## 2017-11-11 DIAGNOSIS — R17 Unspecified jaundice: Secondary | ICD-10-CM | POA: Diagnosis not present

## 2017-11-11 DIAGNOSIS — E785 Hyperlipidemia, unspecified: Secondary | ICD-10-CM | POA: Diagnosis not present

## 2017-11-11 DIAGNOSIS — Z86010 Personal history of colonic polyps: Secondary | ICD-10-CM | POA: Diagnosis not present

## 2017-11-17 DIAGNOSIS — E785 Hyperlipidemia, unspecified: Secondary | ICD-10-CM | POA: Diagnosis not present

## 2017-11-17 DIAGNOSIS — R17 Unspecified jaundice: Secondary | ICD-10-CM | POA: Diagnosis not present

## 2017-11-17 DIAGNOSIS — R1013 Epigastric pain: Secondary | ICD-10-CM | POA: Diagnosis not present

## 2017-11-18 ENCOUNTER — Other Ambulatory Visit: Payer: Self-pay | Admitting: Physician Assistant

## 2017-11-18 DIAGNOSIS — R1013 Epigastric pain: Secondary | ICD-10-CM

## 2017-11-24 ENCOUNTER — Ambulatory Visit
Admission: RE | Admit: 2017-11-24 | Discharge: 2017-11-24 | Disposition: A | Discharge status: Home / Self Care | Payer: Medicare Other | Source: Ambulatory Visit | Attending: Physician Assistant | Admitting: Physician Assistant

## 2017-11-24 DIAGNOSIS — R1013 Epigastric pain: Secondary | ICD-10-CM

## 2017-11-24 DIAGNOSIS — K7689 Other specified diseases of liver: Secondary | ICD-10-CM | POA: Diagnosis not present

## 2017-12-09 DIAGNOSIS — R1013 Epigastric pain: Secondary | ICD-10-CM | POA: Diagnosis not present

## 2017-12-09 DIAGNOSIS — K228 Other specified diseases of esophagus: Secondary | ICD-10-CM | POA: Diagnosis not present

## 2017-12-09 DIAGNOSIS — K317 Polyp of stomach and duodenum: Secondary | ICD-10-CM | POA: Diagnosis not present

## 2017-12-09 DIAGNOSIS — K2930 Chronic superficial gastritis without bleeding: Secondary | ICD-10-CM | POA: Diagnosis not present

## 2017-12-09 DIAGNOSIS — K2270 Barrett's esophagus without dysplasia: Secondary | ICD-10-CM | POA: Diagnosis not present

## 2017-12-09 DIAGNOSIS — K219 Gastro-esophageal reflux disease without esophagitis: Secondary | ICD-10-CM | POA: Diagnosis not present

## 2017-12-15 DIAGNOSIS — M2021 Hallux rigidus, right foot: Secondary | ICD-10-CM | POA: Diagnosis not present

## 2017-12-15 DIAGNOSIS — G8918 Other acute postprocedural pain: Secondary | ICD-10-CM | POA: Diagnosis not present

## 2017-12-15 DIAGNOSIS — M67471 Ganglion, right ankle and foot: Secondary | ICD-10-CM | POA: Diagnosis not present

## 2017-12-15 DIAGNOSIS — K317 Polyp of stomach and duodenum: Secondary | ICD-10-CM | POA: Diagnosis not present

## 2017-12-15 DIAGNOSIS — T84293A Other mechanical complication of internal fixation device of bones of foot and toes, initial encounter: Secondary | ICD-10-CM | POA: Diagnosis not present

## 2017-12-15 DIAGNOSIS — T8484XA Pain due to internal orthopedic prosthetic devices, implants and grafts, initial encounter: Secondary | ICD-10-CM | POA: Diagnosis not present

## 2017-12-15 DIAGNOSIS — K2930 Chronic superficial gastritis without bleeding: Secondary | ICD-10-CM | POA: Diagnosis not present

## 2017-12-15 DIAGNOSIS — K219 Gastro-esophageal reflux disease without esophagitis: Secondary | ICD-10-CM | POA: Diagnosis not present

## 2017-12-15 DIAGNOSIS — Z967 Presence of other bone and tendon implants: Secondary | ICD-10-CM | POA: Diagnosis not present

## 2017-12-15 DIAGNOSIS — K2270 Barrett's esophagus without dysplasia: Secondary | ICD-10-CM | POA: Diagnosis not present

## 2018-01-07 DIAGNOSIS — K219 Gastro-esophageal reflux disease without esophagitis: Secondary | ICD-10-CM | POA: Diagnosis not present

## 2018-01-07 DIAGNOSIS — K2270 Barrett's esophagus without dysplasia: Secondary | ICD-10-CM | POA: Diagnosis not present

## 2018-01-07 DIAGNOSIS — R1013 Epigastric pain: Secondary | ICD-10-CM | POA: Diagnosis not present

## 2018-01-07 DIAGNOSIS — Z86010 Personal history of colonic polyps: Secondary | ICD-10-CM | POA: Diagnosis not present

## 2018-01-07 DIAGNOSIS — K760 Fatty (change of) liver, not elsewhere classified: Secondary | ICD-10-CM | POA: Diagnosis not present

## 2018-01-21 DIAGNOSIS — M67471 Ganglion, right ankle and foot: Secondary | ICD-10-CM | POA: Diagnosis not present

## 2018-01-21 DIAGNOSIS — M79671 Pain in right foot: Secondary | ICD-10-CM | POA: Diagnosis not present

## 2018-01-21 DIAGNOSIS — Z978 Presence of other specified devices: Secondary | ICD-10-CM | POA: Diagnosis not present

## 2018-01-21 DIAGNOSIS — Z5189 Encounter for other specified aftercare: Secondary | ICD-10-CM | POA: Diagnosis not present

## 2018-01-21 DIAGNOSIS — M2021 Hallux rigidus, right foot: Secondary | ICD-10-CM | POA: Diagnosis not present

## 2018-02-22 DIAGNOSIS — M2021 Hallux rigidus, right foot: Secondary | ICD-10-CM | POA: Diagnosis not present

## 2018-02-22 DIAGNOSIS — Z978 Presence of other specified devices: Secondary | ICD-10-CM | POA: Diagnosis not present

## 2018-02-22 DIAGNOSIS — M79671 Pain in right foot: Secondary | ICD-10-CM | POA: Diagnosis not present

## 2018-02-22 DIAGNOSIS — M67471 Ganglion, right ankle and foot: Secondary | ICD-10-CM | POA: Diagnosis not present

## 2018-02-22 DIAGNOSIS — Z5189 Encounter for other specified aftercare: Secondary | ICD-10-CM | POA: Diagnosis not present

## 2018-05-20 DIAGNOSIS — Z0000 Encounter for general adult medical examination without abnormal findings: Secondary | ICD-10-CM | POA: Diagnosis not present

## 2018-05-20 DIAGNOSIS — Z1211 Encounter for screening for malignant neoplasm of colon: Secondary | ICD-10-CM | POA: Diagnosis not present

## 2018-05-20 DIAGNOSIS — F418 Other specified anxiety disorders: Secondary | ICD-10-CM | POA: Diagnosis not present

## 2018-05-20 DIAGNOSIS — Z1239 Encounter for other screening for malignant neoplasm of breast: Secondary | ICD-10-CM | POA: Diagnosis not present

## 2018-05-20 DIAGNOSIS — E785 Hyperlipidemia, unspecified: Secondary | ICD-10-CM | POA: Diagnosis not present

## 2018-05-20 DIAGNOSIS — K760 Fatty (change of) liver, not elsewhere classified: Secondary | ICD-10-CM | POA: Diagnosis not present

## 2018-05-20 DIAGNOSIS — E890 Postprocedural hypothyroidism: Secondary | ICD-10-CM | POA: Diagnosis not present

## 2018-05-20 DIAGNOSIS — K219 Gastro-esophageal reflux disease without esophagitis: Secondary | ICD-10-CM | POA: Diagnosis not present

## 2018-05-20 DIAGNOSIS — Z1159 Encounter for screening for other viral diseases: Secondary | ICD-10-CM | POA: Diagnosis not present

## 2018-05-20 DIAGNOSIS — I341 Nonrheumatic mitral (valve) prolapse: Secondary | ICD-10-CM | POA: Diagnosis not present

## 2018-05-20 DIAGNOSIS — D126 Benign neoplasm of colon, unspecified: Secondary | ICD-10-CM | POA: Diagnosis not present

## 2018-07-12 DIAGNOSIS — Z85828 Personal history of other malignant neoplasm of skin: Secondary | ICD-10-CM | POA: Diagnosis not present

## 2018-07-12 DIAGNOSIS — D1801 Hemangioma of skin and subcutaneous tissue: Secondary | ICD-10-CM | POA: Diagnosis not present

## 2018-07-12 DIAGNOSIS — L570 Actinic keratosis: Secondary | ICD-10-CM | POA: Diagnosis not present

## 2018-07-12 DIAGNOSIS — I788 Other diseases of capillaries: Secondary | ICD-10-CM | POA: Diagnosis not present

## 2018-07-12 DIAGNOSIS — D225 Melanocytic nevi of trunk: Secondary | ICD-10-CM | POA: Diagnosis not present

## 2018-07-12 DIAGNOSIS — L821 Other seborrheic keratosis: Secondary | ICD-10-CM | POA: Diagnosis not present

## 2018-07-16 DIAGNOSIS — K219 Gastro-esophageal reflux disease without esophagitis: Secondary | ICD-10-CM | POA: Diagnosis not present

## 2018-10-07 ENCOUNTER — Other Ambulatory Visit: Payer: Self-pay | Admitting: Internal Medicine

## 2018-10-07 DIAGNOSIS — Z1231 Encounter for screening mammogram for malignant neoplasm of breast: Secondary | ICD-10-CM

## 2018-10-08 ENCOUNTER — Other Ambulatory Visit: Payer: Self-pay | Admitting: Internal Medicine

## 2018-10-08 DIAGNOSIS — M8580 Other specified disorders of bone density and structure, unspecified site: Secondary | ICD-10-CM

## 2018-10-15 DIAGNOSIS — Z23 Encounter for immunization: Secondary | ICD-10-CM | POA: Diagnosis not present

## 2018-10-28 DIAGNOSIS — E785 Hyperlipidemia, unspecified: Secondary | ICD-10-CM | POA: Diagnosis not present

## 2018-10-28 DIAGNOSIS — I341 Nonrheumatic mitral (valve) prolapse: Secondary | ICD-10-CM | POA: Diagnosis not present

## 2018-10-28 DIAGNOSIS — Z1159 Encounter for screening for other viral diseases: Secondary | ICD-10-CM | POA: Diagnosis not present

## 2018-11-04 ENCOUNTER — Other Ambulatory Visit: Payer: Medicare Other

## 2018-12-24 ENCOUNTER — Other Ambulatory Visit: Payer: Self-pay

## 2018-12-24 ENCOUNTER — Ambulatory Visit
Admission: RE | Admit: 2018-12-24 | Discharge: 2018-12-24 | Disposition: A | Discharge status: Home / Self Care | Payer: Medicare Other | Source: Ambulatory Visit | Attending: Internal Medicine | Admitting: Internal Medicine

## 2018-12-24 ENCOUNTER — Other Ambulatory Visit: Payer: Medicare Other

## 2018-12-24 DIAGNOSIS — Z1231 Encounter for screening mammogram for malignant neoplasm of breast: Secondary | ICD-10-CM | POA: Diagnosis not present

## 2019-02-23 ENCOUNTER — Ambulatory Visit: Payer: Medicare Other | Attending: Internal Medicine

## 2019-02-23 DIAGNOSIS — Z23 Encounter for immunization: Secondary | ICD-10-CM | POA: Insufficient documentation

## 2019-02-23 NOTE — Progress Notes (Signed)
   Covid-19 Vaccination Clinic  Name:  Miranda Holland    MRN: [id number] DOB: Apr 03, 1945  02/23/2019  [name] was observed post Covid-19 immunization for 15 minutes without incidence. She was provided with Vaccine Information Sheet and instruction to access the V-Safe system.   [name] was instructed to call 911 with any severe reactions post vaccine: Marland Kitchen Difficulty breathing  . Swelling of your face and throat  . A fast heartbeat  . A bad rash all over your body  . Dizziness and weakness    Immunizations Administered    Name Date Dose VIS Date Route   Pfizer COVID-19 Vaccine 02/23/2019  8:43 AM 0.3 mL 01/14/2019 Intramuscular   Manufacturer: [place]   Lot: [id number]   [place]: [id number]

## 2019-03-13 ENCOUNTER — Ambulatory Visit: Payer: Medicare Other | Attending: Internal Medicine

## 2019-03-13 DIAGNOSIS — Z23 Encounter for immunization: Secondary | ICD-10-CM | POA: Insufficient documentation

## 2019-03-13 NOTE — Progress Notes (Signed)
   Covid-19 Vaccination Clinic  Name:  Miranda Holland    MRN: [id number] DOB: Sep 12, 1945  03/13/2019  Miranda Holland was observed post Covid-19 immunization for 15 minutes without incidence. She was provided with Vaccine Information Sheet and instruction to access the V-Safe system.   Miranda Holland was instructed to call 911 with any severe reactions post vaccine: Marland Kitchen Difficulty breathing  . Swelling of your face and throat  . A fast heartbeat  . A bad rash all over your body  . Dizziness and weakness    Immunizations Administered    Name Date Dose VIS Date Route   Pfizer COVID-19 Vaccine 03/13/2019 11:46 AM 0.3 mL 01/14/2019 Intramuscular   Manufacturer: [place]   Lot: [id number]   [place]: [id number]

## 2019-05-20 DIAGNOSIS — K2270 Barrett's esophagus without dysplasia: Secondary | ICD-10-CM | POA: Diagnosis not present

## 2019-05-20 DIAGNOSIS — E890 Postprocedural hypothyroidism: Secondary | ICD-10-CM | POA: Diagnosis not present

## 2019-05-20 DIAGNOSIS — Z0000 Encounter for general adult medical examination without abnormal findings: Secondary | ICD-10-CM | POA: Diagnosis not present

## 2019-05-20 DIAGNOSIS — K219 Gastro-esophageal reflux disease without esophagitis: Secondary | ICD-10-CM | POA: Diagnosis not present

## 2019-05-20 DIAGNOSIS — Z1389 Encounter for screening for other disorder: Secondary | ICD-10-CM | POA: Diagnosis not present

## 2019-05-20 DIAGNOSIS — E785 Hyperlipidemia, unspecified: Secondary | ICD-10-CM | POA: Diagnosis not present

## 2019-05-20 DIAGNOSIS — Z86010 Personal history of colonic polyps: Secondary | ICD-10-CM | POA: Diagnosis not present

## 2019-09-14 DIAGNOSIS — M79605 Pain in left leg: Secondary | ICD-10-CM | POA: Diagnosis not present

## 2019-11-09 DIAGNOSIS — Z23 Encounter for immunization: Secondary | ICD-10-CM | POA: Diagnosis not present

## 2019-11-23 ENCOUNTER — Other Ambulatory Visit: Payer: Self-pay | Admitting: Internal Medicine

## 2019-11-23 DIAGNOSIS — Z1231 Encounter for screening mammogram for malignant neoplasm of breast: Secondary | ICD-10-CM

## 2019-11-23 DIAGNOSIS — M85859 Other specified disorders of bone density and structure, unspecified thigh: Secondary | ICD-10-CM

## 2019-11-27 DIAGNOSIS — Z23 Encounter for immunization: Secondary | ICD-10-CM | POA: Diagnosis not present

## 2020-03-15 ENCOUNTER — Other Ambulatory Visit: Payer: Medicare Other

## 2020-03-15 ENCOUNTER — Ambulatory Visit
Admission: RE | Admit: 2020-03-15 | Discharge: 2020-03-15 | Disposition: A | Discharge status: Home / Self Care | Payer: Medicare Other | Source: Ambulatory Visit | Attending: Internal Medicine | Admitting: Internal Medicine

## 2020-03-15 ENCOUNTER — Other Ambulatory Visit: Payer: Self-pay

## 2020-03-15 DIAGNOSIS — Z1231 Encounter for screening mammogram for malignant neoplasm of breast: Secondary | ICD-10-CM | POA: Diagnosis not present

## 2020-03-20 ENCOUNTER — Other Ambulatory Visit: Payer: Self-pay | Admitting: Internal Medicine

## 2020-03-20 DIAGNOSIS — R928 Other abnormal and inconclusive findings on diagnostic imaging of breast: Secondary | ICD-10-CM

## 2020-03-23 ENCOUNTER — Ambulatory Visit
Admission: RE | Admit: 2020-03-23 | Discharge: 2020-03-23 | Disposition: A | Discharge status: Home / Self Care | Payer: Medicare Other | Source: Ambulatory Visit | Attending: Internal Medicine | Admitting: Internal Medicine

## 2020-03-23 ENCOUNTER — Other Ambulatory Visit: Payer: Self-pay

## 2020-03-23 DIAGNOSIS — R928 Other abnormal and inconclusive findings on diagnostic imaging of breast: Secondary | ICD-10-CM

## 2020-03-23 DIAGNOSIS — R922 Inconclusive mammogram: Secondary | ICD-10-CM | POA: Diagnosis not present

## 2020-04-02 ENCOUNTER — Other Ambulatory Visit: Payer: Medicare Other

## 2020-04-09 DIAGNOSIS — M5459 Other low back pain: Secondary | ICD-10-CM | POA: Diagnosis not present

## 2020-04-09 DIAGNOSIS — M25561 Pain in right knee: Secondary | ICD-10-CM | POA: Diagnosis not present

## 2020-04-09 DIAGNOSIS — M25562 Pain in left knee: Secondary | ICD-10-CM | POA: Diagnosis not present

## 2020-05-11 DIAGNOSIS — M79642 Pain in left hand: Secondary | ICD-10-CM | POA: Diagnosis not present

## 2020-05-17 DIAGNOSIS — D8989 Other specified disorders involving the immune mechanism, not elsewhere classified: Secondary | ICD-10-CM | POA: Diagnosis not present

## 2020-05-17 DIAGNOSIS — M19041 Primary osteoarthritis, right hand: Secondary | ICD-10-CM | POA: Diagnosis not present

## 2020-05-17 DIAGNOSIS — M79671 Pain in right foot: Secondary | ICD-10-CM | POA: Diagnosis not present

## 2020-05-17 DIAGNOSIS — M2550 Pain in unspecified joint: Secondary | ICD-10-CM | POA: Diagnosis not present

## 2020-05-17 DIAGNOSIS — M19042 Primary osteoarthritis, left hand: Secondary | ICD-10-CM | POA: Diagnosis not present

## 2020-05-17 DIAGNOSIS — Z981 Arthrodesis status: Secondary | ICD-10-CM | POA: Diagnosis not present

## 2020-05-17 DIAGNOSIS — M25461 Effusion, right knee: Secondary | ICD-10-CM | POA: Diagnosis not present

## 2020-05-17 DIAGNOSIS — M7989 Other specified soft tissue disorders: Secondary | ICD-10-CM | POA: Diagnosis not present

## 2020-05-17 DIAGNOSIS — M25562 Pain in left knee: Secondary | ICD-10-CM | POA: Diagnosis not present

## 2020-05-17 DIAGNOSIS — M549 Dorsalgia, unspecified: Secondary | ICD-10-CM | POA: Diagnosis not present

## 2020-05-17 DIAGNOSIS — M79672 Pain in left foot: Secondary | ICD-10-CM | POA: Diagnosis not present

## 2020-05-17 DIAGNOSIS — R768 Other specified abnormal immunological findings in serum: Secondary | ICD-10-CM | POA: Diagnosis not present

## 2020-05-17 DIAGNOSIS — M47816 Spondylosis without myelopathy or radiculopathy, lumbar region: Secondary | ICD-10-CM | POA: Diagnosis not present

## 2020-05-17 DIAGNOSIS — M79643 Pain in unspecified hand: Secondary | ICD-10-CM | POA: Diagnosis not present

## 2020-05-17 DIAGNOSIS — M79641 Pain in right hand: Secondary | ICD-10-CM | POA: Diagnosis not present

## 2020-05-17 DIAGNOSIS — M1990 Unspecified osteoarthritis, unspecified site: Secondary | ICD-10-CM | POA: Diagnosis not present

## 2020-05-17 DIAGNOSIS — M25561 Pain in right knee: Secondary | ICD-10-CM | POA: Diagnosis not present

## 2020-05-17 DIAGNOSIS — M79642 Pain in left hand: Secondary | ICD-10-CM | POA: Diagnosis not present

## 2020-05-17 DIAGNOSIS — M533 Sacrococcygeal disorders, not elsewhere classified: Secondary | ICD-10-CM | POA: Diagnosis not present

## 2020-05-17 DIAGNOSIS — M25462 Effusion, left knee: Secondary | ICD-10-CM | POA: Diagnosis not present

## 2020-05-17 DIAGNOSIS — M25569 Pain in unspecified knee: Secondary | ICD-10-CM | POA: Diagnosis not present

## 2020-05-21 DIAGNOSIS — I341 Nonrheumatic mitral (valve) prolapse: Secondary | ICD-10-CM | POA: Diagnosis not present

## 2020-05-21 DIAGNOSIS — Z0000 Encounter for general adult medical examination without abnormal findings: Secondary | ICD-10-CM | POA: Diagnosis not present

## 2020-05-21 DIAGNOSIS — K219 Gastro-esophageal reflux disease without esophagitis: Secondary | ICD-10-CM | POA: Diagnosis not present

## 2020-05-21 DIAGNOSIS — Z1389 Encounter for screening for other disorder: Secondary | ICD-10-CM | POA: Diagnosis not present

## 2020-05-21 DIAGNOSIS — E890 Postprocedural hypothyroidism: Secondary | ICD-10-CM | POA: Diagnosis not present

## 2020-05-21 DIAGNOSIS — E785 Hyperlipidemia, unspecified: Secondary | ICD-10-CM | POA: Diagnosis not present

## 2020-05-21 DIAGNOSIS — Z7189 Other specified counseling: Secondary | ICD-10-CM | POA: Diagnosis not present

## 2020-05-21 DIAGNOSIS — K2270 Barrett's esophagus without dysplasia: Secondary | ICD-10-CM | POA: Diagnosis not present

## 2020-05-21 DIAGNOSIS — Z86010 Personal history of colonic polyps: Secondary | ICD-10-CM | POA: Diagnosis not present

## 2020-05-21 DIAGNOSIS — M85859 Other specified disorders of bone density and structure, unspecified thigh: Secondary | ICD-10-CM | POA: Diagnosis not present

## 2020-05-21 DIAGNOSIS — D126 Benign neoplasm of colon, unspecified: Secondary | ICD-10-CM | POA: Diagnosis not present

## 2020-06-07 DIAGNOSIS — R768 Other specified abnormal immunological findings in serum: Secondary | ICD-10-CM | POA: Diagnosis not present

## 2020-06-07 DIAGNOSIS — M2550 Pain in unspecified joint: Secondary | ICD-10-CM | POA: Diagnosis not present

## 2020-06-07 DIAGNOSIS — M79643 Pain in unspecified hand: Secondary | ICD-10-CM | POA: Diagnosis not present

## 2020-06-07 DIAGNOSIS — M1990 Unspecified osteoarthritis, unspecified site: Secondary | ICD-10-CM | POA: Diagnosis not present

## 2020-06-07 DIAGNOSIS — M25569 Pain in unspecified knee: Secondary | ICD-10-CM | POA: Diagnosis not present

## 2020-06-07 DIAGNOSIS — M549 Dorsalgia, unspecified: Secondary | ICD-10-CM | POA: Diagnosis not present

## 2020-06-07 DIAGNOSIS — M7989 Other specified soft tissue disorders: Secondary | ICD-10-CM | POA: Diagnosis not present

## 2020-06-11 DIAGNOSIS — M1712 Unilateral primary osteoarthritis, left knee: Secondary | ICD-10-CM | POA: Diagnosis not present

## 2020-07-03 DIAGNOSIS — H938X1 Other specified disorders of right ear: Secondary | ICD-10-CM | POA: Diagnosis not present

## 2020-07-03 DIAGNOSIS — H6981 Other specified disorders of Eustachian tube, right ear: Secondary | ICD-10-CM | POA: Diagnosis not present

## 2020-07-09 DIAGNOSIS — J0140 Acute pansinusitis, unspecified: Secondary | ICD-10-CM | POA: Diagnosis not present

## 2020-07-09 DIAGNOSIS — R030 Elevated blood-pressure reading, without diagnosis of hypertension: Secondary | ICD-10-CM | POA: Diagnosis not present

## 2020-07-12 DIAGNOSIS — D485 Neoplasm of uncertain behavior of skin: Secondary | ICD-10-CM | POA: Diagnosis not present

## 2020-07-12 DIAGNOSIS — L905 Scar conditions and fibrosis of skin: Secondary | ICD-10-CM | POA: Diagnosis not present

## 2020-07-12 DIAGNOSIS — D225 Melanocytic nevi of trunk: Secondary | ICD-10-CM | POA: Diagnosis not present

## 2020-07-12 DIAGNOSIS — L309 Dermatitis, unspecified: Secondary | ICD-10-CM | POA: Diagnosis not present

## 2020-07-12 DIAGNOSIS — Z85828 Personal history of other malignant neoplasm of skin: Secondary | ICD-10-CM | POA: Diagnosis not present

## 2020-07-12 DIAGNOSIS — L814 Other melanin hyperpigmentation: Secondary | ICD-10-CM | POA: Diagnosis not present

## 2020-07-12 DIAGNOSIS — D1801 Hemangioma of skin and subcutaneous tissue: Secondary | ICD-10-CM | POA: Diagnosis not present

## 2020-07-12 DIAGNOSIS — L821 Other seborrheic keratosis: Secondary | ICD-10-CM | POA: Diagnosis not present

## 2020-08-31 DIAGNOSIS — M1712 Unilateral primary osteoarthritis, left knee: Secondary | ICD-10-CM | POA: Diagnosis not present

## 2020-09-06 DIAGNOSIS — H524 Presbyopia: Secondary | ICD-10-CM | POA: Diagnosis not present

## 2020-09-06 DIAGNOSIS — H5213 Myopia, bilateral: Secondary | ICD-10-CM | POA: Diagnosis not present

## 2020-09-06 DIAGNOSIS — H2513 Age-related nuclear cataract, bilateral: Secondary | ICD-10-CM | POA: Diagnosis not present

## 2020-09-06 DIAGNOSIS — H52223 Regular astigmatism, bilateral: Secondary | ICD-10-CM | POA: Diagnosis not present

## 2020-09-07 DIAGNOSIS — M1712 Unilateral primary osteoarthritis, left knee: Secondary | ICD-10-CM | POA: Diagnosis not present

## 2020-09-12 DIAGNOSIS — K2270 Barrett's esophagus without dysplasia: Secondary | ICD-10-CM | POA: Diagnosis not present

## 2020-09-12 DIAGNOSIS — Z86010 Personal history of colonic polyps: Secondary | ICD-10-CM | POA: Diagnosis not present

## 2020-09-14 DIAGNOSIS — M1712 Unilateral primary osteoarthritis, left knee: Secondary | ICD-10-CM | POA: Diagnosis not present

## 2020-10-20 DIAGNOSIS — Z20822 Contact with and (suspected) exposure to covid-19: Secondary | ICD-10-CM | POA: Diagnosis not present

## 2020-10-23 DIAGNOSIS — J329 Chronic sinusitis, unspecified: Secondary | ICD-10-CM | POA: Diagnosis not present

## 2020-10-23 DIAGNOSIS — R051 Acute cough: Secondary | ICD-10-CM | POA: Diagnosis not present

## 2020-10-23 DIAGNOSIS — U071 COVID-19: Secondary | ICD-10-CM | POA: Diagnosis not present

## 2020-11-24 DIAGNOSIS — Z23 Encounter for immunization: Secondary | ICD-10-CM | POA: Diagnosis not present

## 2021-02-06 DIAGNOSIS — Z23 Encounter for immunization: Secondary | ICD-10-CM | POA: Diagnosis not present

## 2021-02-08 ENCOUNTER — Other Ambulatory Visit: Payer: Self-pay | Admitting: Internal Medicine

## 2021-02-08 DIAGNOSIS — Z1231 Encounter for screening mammogram for malignant neoplasm of breast: Secondary | ICD-10-CM

## 2021-03-25 ENCOUNTER — Ambulatory Visit
Admission: RE | Admit: 2021-03-25 | Discharge: 2021-03-25 | Disposition: A | Discharge status: Home / Self Care | Payer: Medicare Other | Source: Ambulatory Visit | Attending: Internal Medicine | Admitting: Internal Medicine

## 2021-03-25 DIAGNOSIS — Z1231 Encounter for screening mammogram for malignant neoplasm of breast: Secondary | ICD-10-CM

## 2021-05-30 DIAGNOSIS — F410 Panic disorder [episodic paroxysmal anxiety] without agoraphobia: Secondary | ICD-10-CM | POA: Diagnosis not present

## 2021-05-30 DIAGNOSIS — Z1389 Encounter for screening for other disorder: Secondary | ICD-10-CM | POA: Diagnosis not present

## 2021-05-30 DIAGNOSIS — E890 Postprocedural hypothyroidism: Secondary | ICD-10-CM | POA: Diagnosis not present

## 2021-05-30 DIAGNOSIS — Z0000 Encounter for general adult medical examination without abnormal findings: Secondary | ICD-10-CM | POA: Diagnosis not present

## 2021-05-30 DIAGNOSIS — Z86010 Personal history of colonic polyps: Secondary | ICD-10-CM | POA: Diagnosis not present

## 2021-05-30 DIAGNOSIS — M1712 Unilateral primary osteoarthritis, left knee: Secondary | ICD-10-CM | POA: Diagnosis not present

## 2021-05-30 DIAGNOSIS — Z8616 Personal history of COVID-19: Secondary | ICD-10-CM | POA: Diagnosis not present

## 2021-05-30 DIAGNOSIS — B001 Herpesviral vesicular dermatitis: Secondary | ICD-10-CM | POA: Diagnosis not present

## 2021-05-30 DIAGNOSIS — K219 Gastro-esophageal reflux disease without esophagitis: Secondary | ICD-10-CM | POA: Diagnosis not present

## 2021-05-30 DIAGNOSIS — E785 Hyperlipidemia, unspecified: Secondary | ICD-10-CM | POA: Diagnosis not present

## 2021-06-03 ENCOUNTER — Other Ambulatory Visit: Payer: Self-pay | Admitting: Internal Medicine

## 2021-06-03 DIAGNOSIS — M85859 Other specified disorders of bone density and structure, unspecified thigh: Secondary | ICD-10-CM

## 2021-06-04 DIAGNOSIS — U071 COVID-19: Secondary | ICD-10-CM | POA: Diagnosis not present

## 2021-06-12 DIAGNOSIS — R718 Other abnormality of red blood cells: Secondary | ICD-10-CM | POA: Diagnosis not present

## 2021-07-23 ENCOUNTER — Ambulatory Visit (INDEPENDENT_AMBULATORY_CARE_PROVIDER_SITE_OTHER): Payer: Medicare Other | Admitting: Family

## 2021-07-23 ENCOUNTER — Encounter: Payer: Self-pay | Admitting: Family

## 2021-07-23 VITALS — BP 138/82 | HR 88 | Temp 97.9°F | Resp 16 | Ht 62.0 in | Wt 119.2 lb

## 2021-07-23 DIAGNOSIS — E782 Mixed hyperlipidemia: Secondary | ICD-10-CM | POA: Diagnosis not present

## 2021-07-23 DIAGNOSIS — R17 Unspecified jaundice: Secondary | ICD-10-CM | POA: Insufficient documentation

## 2021-07-23 DIAGNOSIS — K760 Fatty (change of) liver, not elsewhere classified: Secondary | ICD-10-CM

## 2021-07-23 DIAGNOSIS — D649 Anemia, unspecified: Secondary | ICD-10-CM | POA: Diagnosis not present

## 2021-07-23 DIAGNOSIS — R12 Heartburn: Secondary | ICD-10-CM

## 2021-07-23 DIAGNOSIS — F411 Generalized anxiety disorder: Secondary | ICD-10-CM | POA: Diagnosis not present

## 2021-07-23 LAB — CBC WITH DIFFERENTIAL/PLATELET
Basophils Absolute: 0.1 10*3/uL (ref 0.0–0.1)
Basophils Relative: 1.1 % (ref 0.0–3.0)
Eosinophils Absolute: 0.2 10*3/uL (ref 0.0–0.7)
Eosinophils Relative: 3.7 % (ref 0.0–5.0)
HCT: 38.5 % (ref 36.0–46.0)
Hemoglobin: 13.4 g/dL (ref 12.0–15.0)
Lymphocytes Relative: 29 % (ref 12.0–46.0)
Lymphs Abs: 1.5 10*3/uL (ref 0.7–4.0)
MCHC: 34.7 g/dL (ref 30.0–36.0)
MCV: 105.2 fl — ABNORMAL HIGH (ref 78.0–100.0)
Monocytes Absolute: 0.4 10*3/uL (ref 0.1–1.0)
Monocytes Relative: 6.9 % (ref 3.0–12.0)
Neutro Abs: 3.1 10*3/uL (ref 1.4–7.7)
Neutrophils Relative %: 59.3 % (ref 43.0–77.0)
Platelets: 265 10*3/uL (ref 150.0–400.0)
RBC: 3.66 Mil/uL — ABNORMAL LOW (ref 3.87–5.11)
RDW: 15.7 % — ABNORMAL HIGH (ref 11.5–15.5)
WBC: 5.3 10*3/uL (ref 4.0–10.5)

## 2021-07-23 LAB — IBC + FERRITIN
Ferritin: 494.5 ng/mL — ABNORMAL HIGH (ref 10.0–291.0)
Iron: 162 ug/dL — ABNORMAL HIGH (ref 42–145)
Saturation Ratios: 49.9 % (ref 20.0–50.0)
TIBC: 324.8 ug/dL (ref 250.0–450.0)
Transferrin: 232 mg/dL (ref 212.0–360.0)

## 2021-07-23 LAB — HEPATIC FUNCTION PANEL
ALT: 11 U/L (ref 0–35)
AST: 14 U/L (ref 0–37)
Albumin: 4.7 g/dL (ref 3.5–5.2)
Alkaline Phosphatase: 61 U/L (ref 39–117)
Bilirubin, Direct: 0.2 mg/dL (ref 0.0–0.3)
Total Bilirubin: 1.1 mg/dL (ref 0.2–1.2)
Total Protein: 7.1 g/dL (ref 6.0–8.3)

## 2021-07-23 NOTE — Progress Notes (Signed)
New Patient Office Visit  Subjective:  Patient ID: Miranda Holland, female    DOB: Feb 21, 1945  Age: 76 y.o. MRN: [id number]  CC:  Chief Complaint  Patient presents with   Establish Care    HPI Miranda Holland is here to establish care as a new patient.  Prior provider was: Dr. Naaman Holland Pt is without acute concerns.   chronic concerns:  Anxiety: xanax, finds only a few times a year. Typically if she is traveling or anticipating an event she will need this as increased anxiety.     07/23/2021    9:28 AM  PHQ9 SCORE ONLY  PHQ-9 Total Score 1   Hld: pravastatin 20 mg nightly, doing well. Tries to watch her cholesterol in her diet. Does exercise, golfs and also walks 5 days a week, 2-4 miles a day.   Heartburn: takes pepcid 20 mg once daily. Does see GI, upper EGD 2019 , mild barretts esophagus. Last summer repeated and was improved. No longer needing colonscopy either per GI.   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 Had been on levothyroxine for about a year and has been stable.   Anemia: does worry her, but she denies blood in stool. She does have constipation, occasional blood on toilet paper but very rare. No fatigue.  No sob  Elevated bilirubin: does take ibuprofen for right knee pain was taking daily use, however stopped maybe six weeks ago, taken once over the last six weeks. Doesn't take tylenol. No ruq abd pain. Was drinking 2 glasses wine nightly, stopped completely as of may 1st. No herbal supplements.   Past Medical History:  Diagnosis Date   Arthritis    OSTEO   Hypertension    STD (sexually transmitted disease)    HSV 1    Past Surgical History:  Procedure Laterality Date    submucous myoma, resection  1997   BLADDER REPAIR  2007   with mesh, from child bearing   BREAST EXCISIONAL BIOPSY Left 1987   benign   BREAST SURGERY  1987   Lt Breast Bx Benign   THYROIDECTOMY  1991   hemi(left)   TOE FUSION   01/2012   LEFT Big Toe   TUBAL LIGATION      Family History  Problem Relation Age of Onset   Diabetes Mother    Heart disease Mother    Cirrhosis Father    Alcoholism Father    Hypertension Sister    Heart disease Brother    Breast cancer Paternal Aunt     Social History   Socioeconomic History   Marital status: Married    Spouse name: Not on file   Number of children: 2   Years of education: Not on file   Highest education level: Not on file  Occupational History   Occupation: retired Marine scientist  Tobacco Use   Smoking status: Never   Smokeless tobacco: Never  Vaping Use   Vaping Use: Never used  Substance and Sexual Activity   Alcohol use: Not Currently    Alcohol/week: 21.0 standard drinks of alcohol    Types: 14 Glasses of wine, 7 Standard drinks or equivalent per week    Comment: however stopped drinking may 1   Drug use: No   Sexual activity: Yes    Partners: Male    Birth control/protection: Post-menopausal  Other Topics Concern   Not on file  Social History Narrative   Two girls   Social Determinants  of Health   Financial Resource Strain: Not on file  Food Insecurity: Not on file  Transportation Needs: Not on file  Physical Activity: Not on file  Stress: Not on file  Social Connections: Not on file  Intimate Partner Violence: Not on file    Outpatient Medications Prior to Visit  Medication Sig Dispense Refill   ALPRAZolam (XANAX) 0.25 MG tablet Take 1 tablet (0.25 mg total) by mouth as needed for sleep. 30 tablet 1   famotidine (PEPCID) 20 MG tablet Take by mouth.     Multiple Vitamin (MULTI-VITAMIN PO) Take by mouth daily.     pravastatin (PRAVACHOL) 20 MG tablet 1 tablet     valACYclovir (VALTREX) 1000 MG tablet Take 1 tablet (1,000 mg total) by mouth 2 (two) times daily. 30 tablet 2   ibuprofen (ADVIL,MOTRIN) 200 MG tablet Take 200 mg by mouth every 6 (six) hours as needed for pain.     pantoprazole (PROTONIX) 40 MG tablet Take 1 tablet (40 mg  total) by mouth daily. 30 tablet 1   No facility-administered medications prior to visit.    No Known Allergies      Objective:    Physical Exam Vitals reviewed.  Constitutional:      General: She is not in acute distress.    Appearance: Normal appearance. She is not ill-appearing or toxic-appearing.  HENT:     Right Ear: Tympanic membrane normal.     Left Ear: Tympanic membrane normal.     Mouth/Throat:     Mouth: Mucous membranes are moist.     Pharynx: No pharyngeal swelling.     Tonsils: No tonsillar exudate.  Eyes:     Extraocular Movements: Extraocular movements intact.     Conjunctiva/sclera: Conjunctivae normal.     Pupils: Pupils are equal, round, and reactive to light.  Neck:     Thyroid: No thyroid mass.  Cardiovascular:     Rate and Rhythm: Normal rate and regular rhythm.  Pulmonary:     Effort: Pulmonary effort is normal.     Breath sounds: Normal breath sounds.  Abdominal:     General: Abdomen is flat. Bowel sounds are normal.     Palpations: Abdomen is soft.     Tenderness: There is no abdominal tenderness.  Lymphadenopathy:     Cervical:     Right cervical: No superficial cervical adenopathy.    Left cervical: No superficial cervical adenopathy.  Skin:    General: Skin is warm.     Capillary Refill: Capillary refill takes less than 2 seconds.  Neurological:     General: No focal deficit present.     Mental Status: She is alert and oriented to person, place, and time.  Psychiatric:        Mood and Affect: Mood normal.        Behavior: Behavior normal.        Thought Content: Thought content normal.        Judgment: Judgment normal.       BP 138/82   Pulse 88   Temp 97.9 F (36.6 C)   Resp 16   Ht $R'5\' 2"'hm$  (1.575 m)   Wt 119 lb 4 oz (54.1 kg)   SpO2 99%   BMI 21.81 kg/m  Wt Readings from Last 3 Encounters:  07/23/21 119 lb 4 oz (54.1 kg)  06/22/12 122 lb (55.3 kg)     Health Maintenance Due  Topic Date Due   Hepatitis C Screening   Never done  TETANUS/TDAP  Never done   Zoster Vaccines- Shingrix (1 of 2) Never done   Pneumonia Vaccine 63+ Years old (1 - PCV) Never done   COVID-19 Vaccine (3 - Pfizer series) 05/08/2019    There are no preventive care reminders to display for this patient.  No results found for: "TSH" No results found for: "WBC", "HGB", "HCT", "MCV", "PLT" No results found for: "NA", "K", "CHLORIDE", "CO2", "GLUCOSE", "BUN", "CREATININE", "BILITOT", "ALKPHOS", "AST", "ALT", "PROT", "ALBUMIN", "CALCIUM", "ANIONGAP", "EGFR", "GFR" No results found for: "CHOL" No results found for: "HDL" No results found for: "LDLCALC" No results found for: "TRIG" No results found for: "CHOLHDL" No results found for: "HGBA1C"    Assessment & Plan:   Problem List Items Addressed This Visit       Other   Heartburn - Primary (Chronic)    Continue pepcid 20 mg once daily.  Try to decrease and or avoid spicy foods, fried fatty foods, and also caffeine and chocolate as these can increase heartburn symptoms.        Anxiety state    xanax 0.25 mg prn  Work on anxiety reducing techniques      Mixed hyperlipidemia    Continue pravastatin  Work on low chol diet exercise as tolerated      Relevant Medications   pravastatin (PRAVACHOL) 20 MG tablet   Anemia    Order cbc order ibc ferritin Order fobt pending results      Relevant Orders   Fecal occult blood, imunochemical   CBC with Differential   IBC + Ferritin   Elevated bilirubin    Order fracti bilirubin pending results Consider US abdomen if needed Order hepatic function panel      Relevant Orders   Hepatic Function Panel   US ABDOMEN LIMITED RUQ (LIVER/GB)   Bilirubin, fractionated(tot/dir/indir)   Other Visit Diagnoses     Fatty liver       Relevant Orders   US ABDOMEN LIMITED RUQ (LIVER/GB)   Bilirubin, fractionated(tot/dir/indir)       No orders of the defined types were placed in this encounter.   Follow-up: Return in about  6 months (around 01/22/2022) for regular follow up appt come fasting.    Eugenia Pancoast, FNP

## 2021-07-23 NOTE — Assessment & Plan Note (Signed)
Continue pravastatin  Work on low chol diet exercise as tolerated

## 2021-07-23 NOTE — Assessment & Plan Note (Signed)
Order fracti bilirubin pending results Consider US abdomen if needed Order hepatic function panel

## 2021-07-23 NOTE — Assessment & Plan Note (Signed)
Continue pepcid 20 mg once daily.  Try to decrease and or avoid spicy foods, fried fatty foods, and also caffeine and chocolate as these can increase heartburn symptoms.

## 2021-07-23 NOTE — Assessment & Plan Note (Addendum)
Order cbc order ibc ferritin Order fobt pending results

## 2021-07-23 NOTE — Patient Instructions (Addendum)
Stop by the lab prior to leaving today. I will notify you of your results once received.    Your imaging for ultrasound abdomen has been sent over electronically.  Has been scheduled at the following location: please call to make appt.  [place]:   [address] [place] Phone [phone],  8-430 pm   Welcome to our clinic, I am happy to have you as my new patient. I am excited to continue on this healthcare journey with you.  Stop by the lab prior to leaving today. I will notify you of your results once received.   Please keep in mind Any my chart messages you send have up to a three business day turnaround for a response.  Phone calls may take up to a one full business day turnaround for a  response.   If you need a medication refill I recommend you request it through the pharmacy as this is easiest for [place] rather than sending a message and or phone call.   Due to recent changes in healthcare laws, you may see results of your imaging and/or laboratory studies on MyChart before I have had a chance to review them.  I understand that in some cases there may be results that are confusing or concerning to you. Please understand that not all results are received at the same time and often I may need to interpret multiple results in order to provide you with the best plan of care or course of treatment. Therefore, I ask that you please give me 2 business days to thoroughly review all your results before contacting my office for clarification. Should we see a critical lab result, you will be contacted sooner.   It was a pleasure seeing you today! Please do not hesitate to reach out with any questions and or concerns.  Regards,   Eugenia Pancoast FNP-C

## 2021-07-23 NOTE — Assessment & Plan Note (Signed)
xanax 0.25 mg prn  Work on anxiety reducing techniques

## 2021-07-24 ENCOUNTER — Other Ambulatory Visit (INDEPENDENT_AMBULATORY_CARE_PROVIDER_SITE_OTHER): Payer: Medicare Other

## 2021-07-24 ENCOUNTER — Other Ambulatory Visit: Payer: Medicare Other

## 2021-07-24 DIAGNOSIS — D649 Anemia, unspecified: Secondary | ICD-10-CM | POA: Diagnosis not present

## 2021-07-24 LAB — BILIRUBIN, FRACTIONATED(TOT/DIR/INDIR)
Bilirubin, Direct: 0.2 mg/dL (ref 0.0–0.2)
Indirect Bilirubin: 0.8 mg/dL (calc) (ref 0.2–1.2)
Total Bilirubin: 1 mg/dL (ref 0.2–1.2)

## 2021-07-24 NOTE — Progress Notes (Signed)
Is pt currently taking daily iron ?  Bilirubin back to normal however still recommend pt get US abdomen for look at the liver.

## 2021-07-25 ENCOUNTER — Other Ambulatory Visit: Payer: Medicare Other

## 2021-07-25 ENCOUNTER — Other Ambulatory Visit: Payer: Self-pay | Admitting: Family

## 2021-07-25 NOTE — Progress Notes (Signed)
Ok pending results of liver abdominal US if negative consider hematology

## 2021-08-01 ENCOUNTER — Ambulatory Visit
Admission: RE | Admit: 2021-08-01 | Discharge: 2021-08-01 | Disposition: A | Discharge status: Home / Self Care | Payer: Medicare Other | Source: Ambulatory Visit | Attending: Family | Admitting: Family

## 2021-08-01 DIAGNOSIS — R17 Unspecified jaundice: Secondary | ICD-10-CM

## 2021-08-01 DIAGNOSIS — K760 Fatty (change of) liver, not elsewhere classified: Secondary | ICD-10-CM

## 2021-08-02 ENCOUNTER — Encounter: Payer: Self-pay | Admitting: Family

## 2021-08-02 DIAGNOSIS — R7989 Other specified abnormal findings of blood chemistry: Secondary | ICD-10-CM

## 2021-08-08 ENCOUNTER — Telehealth: Payer: Self-pay | Admitting: Hematology and Oncology

## 2021-08-08 NOTE — Telephone Encounter (Signed)
Scheduled appt per 6/30 referral. Pt is aware of appt date and time. Pt is aware to arrive 15 mins prior to appt time and to bring and updated insurance card. Pt is aware of appt location.   

## 2021-08-23 DIAGNOSIS — M1712 Unilateral primary osteoarthritis, left knee: Secondary | ICD-10-CM | POA: Diagnosis not present

## 2021-09-11 ENCOUNTER — Other Ambulatory Visit: Payer: Self-pay

## 2021-09-11 ENCOUNTER — Inpatient Hospital Stay: Payer: Medicare Other | Attending: Hematology and Oncology | Admitting: Hematology and Oncology

## 2021-09-11 ENCOUNTER — Inpatient Hospital Stay: Payer: Medicare Other

## 2021-09-11 VITALS — BP 145/73 | HR 86 | Temp 98.5°F | Resp 18 | Ht 62.0 in | Wt 120.0 lb

## 2021-09-11 DIAGNOSIS — R7989 Other specified abnormal findings of blood chemistry: Secondary | ICD-10-CM

## 2021-09-11 DIAGNOSIS — M1990 Unspecified osteoarthritis, unspecified site: Secondary | ICD-10-CM | POA: Diagnosis not present

## 2021-09-11 DIAGNOSIS — Z87891 Personal history of nicotine dependence: Secondary | ICD-10-CM | POA: Insufficient documentation

## 2021-09-11 DIAGNOSIS — I10 Essential (primary) hypertension: Secondary | ICD-10-CM | POA: Insufficient documentation

## 2021-09-11 DIAGNOSIS — Z800 Family history of malignant neoplasm of digestive organs: Secondary | ICD-10-CM | POA: Insufficient documentation

## 2021-09-11 DIAGNOSIS — Z808 Family history of malignant neoplasm of other organs or systems: Secondary | ICD-10-CM | POA: Diagnosis not present

## 2021-09-11 LAB — CBC WITH DIFFERENTIAL (CANCER CENTER ONLY)
Abs Immature Granulocytes: 0.03 10*3/uL (ref 0.00–0.07)
Basophils Absolute: 0.1 10*3/uL (ref 0.0–0.1)
Basophils Relative: 1 %
Eosinophils Absolute: 0.2 10*3/uL (ref 0.0–0.5)
Eosinophils Relative: 3 %
HCT: 33.9 % — ABNORMAL LOW (ref 36.0–46.0)
Hemoglobin: 12.7 g/dL (ref 12.0–15.0)
Immature Granulocytes: 0 %
Lymphocytes Relative: 28 %
Lymphs Abs: 2 10*3/uL (ref 0.7–4.0)
MCH: 36.9 pg — ABNORMAL HIGH (ref 26.0–34.0)
MCHC: 37.5 g/dL — ABNORMAL HIGH (ref 30.0–36.0)
MCV: 98.5 fL (ref 80.0–100.0)
Monocytes Absolute: 0.6 10*3/uL (ref 0.1–1.0)
Monocytes Relative: 8 %
Neutro Abs: 4.3 10*3/uL (ref 1.7–7.7)
Neutrophils Relative %: 60 %
Platelet Count: 265 10*3/uL (ref 150–400)
RBC: 3.44 MIL/uL — ABNORMAL LOW (ref 3.87–5.11)
RDW: 16 % — ABNORMAL HIGH (ref 11.5–15.5)
WBC Count: 7.2 10*3/uL (ref 4.0–10.5)
nRBC: 0.3 % — ABNORMAL HIGH (ref 0.0–0.2)

## 2021-09-11 LAB — IRON AND IRON BINDING CAPACITY (CC-WL,HP ONLY)
Iron: 132 ug/dL (ref 28–170)
Saturation Ratios: 40 % — ABNORMAL HIGH (ref 10.4–31.8)
TIBC: 331 ug/dL (ref 250–450)
UIBC: 199 ug/dL

## 2021-09-11 LAB — CMP (CANCER CENTER ONLY)
ALT: 15 U/L (ref 0–44)
AST: 17 U/L (ref 15–41)
Albumin: 4.6 g/dL (ref 3.5–5.0)
Alkaline Phosphatase: 61 U/L (ref 38–126)
Anion gap: 9 (ref 5–15)
BUN: 21 mg/dL (ref 8–23)
CO2: 25 mmol/L (ref 22–32)
Calcium: 9.3 mg/dL (ref 8.9–10.3)
Chloride: 106 mmol/L (ref 98–111)
Creatinine: 0.84 mg/dL (ref 0.44–1.00)
GFR, Estimated: >60 mL/min (ref >60)
Glucose, Bld: 116 mg/dL — ABNORMAL HIGH (ref 70–99)
Potassium: 4 mmol/L (ref 3.5–5.1)
Sodium: 140 mmol/L (ref 135–145)
Total Bilirubin: 1.1 mg/dL (ref 0.3–1.2)
Total Protein: 7.3 g/dL (ref 6.5–8.1)

## 2021-09-11 LAB — SEDIMENTATION RATE: Sed Rate: 0 mm/hr (ref 0–22)

## 2021-09-11 LAB — C-REACTIVE PROTEIN: CRP: 0.6 mg/dL (ref <1.0)

## 2021-09-11 NOTE — Progress Notes (Signed)
[place] [phone]   [phone] [phone] NOTE  Patient Care Team: Eugenia Pancoast, FNP as PCP - General (Family Medicine)  Hematological/Oncological History # Elevated Ferritin  07/23/2021: WBC 5.3, hgb 13.4, MCV 105.2, Plt 265, Ferritin 494.5, Sat 49.9%, iron 162.  08/01/2021: US abdomen showed no evidence of abnormality in the liver/gallbladder  09/11/2021: establish care with Dr. Lorenso Courier   CHIEF COMPLAINTS/PURPOSE OF CONSULTATION:  " Elevated Ferritin  "  HISTORY OF PRESENTING ILLNESS:  Miranda Holland 76 y.o. female with medical history significant for osteoarthritis and hypertension who presents for evaluation of elevated ferritin and increased MCV.   On review of the previous records Miranda Holland has had chronic issues with elevated bilirubin.  Recently as part of her evaluation she underwent testing on 07/23/2021.  She went with a count 5.3, hemoglobin 13.4, MCV 105.2, and platelets of 265.  Her ferritin was noted to be 494.5 with iron saturation of 49.9% with an iron of 162.  She underwent an ultrasound of her liver on 08/01/2021 which showed no evidence of abnormality in the liver or gallbladder.  Due to concern for elevated ferritin she was referred to hematology for further evaluation and management.  On exam today Miranda Holland reports that she consistently has a lower blood cell count and abnormal MCV.  This is going on for approximately 2 years time.  She also has a "very mildly elevated bilirubin".  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  She reports that she recently switched primary.  As part of her evaluation she had ferritin testing which was elevated.  She reports that she does not have any symptoms at this time.  She has great energy and strong appetite.  She reports that she does have some issues with joint pain, particularly in the knees.  She reports that she also has some trouble with tendinitis.  She has been  evaluated by rheumatologist and not found to have any underlying rheumatological diseases.  She had a gel injection of the knees coming up in the near future.  On further discussion she notes drinks possibly 1 to 2 glasses of wine at dinner nightly.  She reports that she was told to lay off the alcohol and noticed that there was a decrease in her bilirubin this occurred.  On review of the family history the patient reports that her father died of cirrhosis though he was not alcoholic.  Her mother had schizophrenia and died of a myocardial infarction.  Her paternal grandmother had colon cancer and her maternal grandmother had biliary cancer.  She has 2 children both of whom are healthy.  She also had a sister with RCC and neuroendocrine tumor of the lung.  She reports that she smoked some in nursing school and smoked about 1 cigarette/year after that time.  She reports that she previously worked as a Marine scientist and did actually rotate through hematology oncology.  She otherwise denies any fevers, chills, sweats, nausea, vomiting or diarrhea.  A full 10 point ROS is listed below.  MEDICAL HISTORY:  Past Medical History:  Diagnosis Date   Arthritis    OSTEO   Hypertension    STD (sexually transmitted disease)    HSV 1    SURGICAL HISTORY: Past Surgical History:  Procedure Laterality Date    submucous myoma, resection  1997   BLADDER REPAIR  2007   with mesh, from child bearing   BREAST EXCISIONAL BIOPSY Left 1987  benign   BREAST SURGERY  1987   Lt Breast Bx Benign   THYROIDECTOMY  1991   hemi(left)   TOE FUSION  01/2012   LEFT Big Toe   TUBAL LIGATION      SOCIAL HISTORY: Social History   Socioeconomic History   Marital status: Married    Spouse name: Not on file   Number of children: 2   Years of education: Not on file   Highest education level: Not on file  Occupational History   Occupation: retired Marine scientist  Tobacco Use   Smoking status: Never   Smokeless tobacco: Never  Vaping  Use   Vaping Use: Never used  Substance and Sexual Activity   Alcohol use: Not Currently    Alcohol/week: 21.0 standard drinks of alcohol    Types: 14 Glasses of wine, 7 Standard drinks or equivalent per week    Comment: however stopped drinking may 1   Drug use: No   Sexual activity: Yes    Partners: Male    Birth control/protection: Post-menopausal  Other Topics Concern   Not on file  Social History Narrative   Two girls   Social Determinants of Health   Financial Resource Strain: Not on file  Food Insecurity: Not on file  Transportation Needs: Not on file  Physical Activity: Not on file  Stress: Not on file  Social Connections: Not on file  Intimate Partner Violence: Not on file    FAMILY HISTORY: Family History  Problem Relation Age of Onset   Diabetes Mother    Heart disease Mother    Cirrhosis Father    Alcoholism Father    Hypertension Sister    Heart disease Brother    Breast cancer Paternal Aunt     ALLERGIES:  is allergic to meperidine hcl and oxycodone hcl.  MEDICATIONS:  Current Outpatient Medications  Medication Sig Dispense Refill   famotidine (PEPCID) 20 MG tablet 1 tablet at bedtime as needed     ALPRAZolam (XANAX) 0.25 MG tablet Take 1 tablet (0.25 mg total) by mouth as needed for sleep. 30 tablet 1   cetirizine (ZYRTEC ALLERGY) 10 MG tablet 1 tablet     Multiple Vitamin (MULTI-VITAMIN PO) Take by mouth daily.     pravastatin (PRAVACHOL) 20 MG tablet 1 tablet     valACYclovir (VALTREX) 1000 MG tablet Take 1 tablet (1,000 mg total) by mouth 2 (two) times daily. 30 tablet 2   No current facility-administered medications for this visit.    REVIEW OF SYSTEMS:   Constitutional: ( - ) fevers, ( - )  chills , ( - ) night sweats Eyes: ( - ) blurriness of vision, ( - ) double vision, ( - ) watery eyes Ears, nose, mouth, throat, and face: ( - ) mucositis, ( - ) sore throat Respiratory: ( - ) cough, ( - ) dyspnea, ( - ) wheezes Cardiovascular: ( - )  palpitation, ( - ) chest discomfort, ( - ) lower extremity swelling Gastrointestinal:  ( - ) nausea, ( - ) heartburn, ( - ) change in bowel habits Skin: ( - ) abnormal skin rashes Lymphatics: ( - ) new lymphadenopathy, ( - ) easy bruising Neurological: ( - ) numbness, ( - ) tingling, ( - ) new weaknesses Behavioral/Psych: ( - ) mood change, ( - ) new changes  All other systems were reviewed with the patient and are negative.  PHYSICAL EXAMINATION:  Vitals:   09/11/21 1318  BP: (!) 145/73  Pulse: 86  Resp: 18  Temp: 98.5 F (36.9 C)  SpO2: 100%   Filed Weights   09/11/21 1318  Weight: 120 lb (54.4 kg)    GENERAL: well appearing elderly Caucasian female in NAD  SKIN: skin color, texture, turgor are normal, no rashes or significant lesions EYES: conjunctiva are pink and non-injected, sclera clear LUNGS: clear to auscultation and percussion with normal breathing effort HEART: regular rate & rhythm and no murmurs and no lower extremity edema Musculoskeletal: no cyanosis of digits and no clubbing  PSYCH: alert & oriented x 3, fluent speech NEURO: no focal motor/sensory deficits  LABORATORY DATA:  I have reviewed the data as listed    Latest Ref Rng & Units 09/11/2021    2:10 PM 07/23/2021    9:52 AM  CBC  WBC 4.0 - 10.5 K/uL 7.2  5.3   Hemoglobin 12.0 - 15.0 g/dL 12.7  13.4   Hematocrit 36.0 - 46.0 % 33.9  38.5   Platelets 150 - 400 K/uL 265  265.0        Latest Ref Rng & Units 09/11/2021    2:10 PM 07/23/2021    9:57 AM 07/23/2021    9:52 AM  CMP  Glucose 70 - 99 mg/dL 116     BUN 8 - 23 mg/dL 21     Creatinine 0.44 - 1.00 mg/dL 0.84     Sodium 135 - 145 mmol/L 140     Potassium 3.5 - 5.1 mmol/L 4.0     Chloride 98 - 111 mmol/L 106     CO2 22 - 32 mmol/L 25     Calcium 8.9 - 10.3 mg/dL 9.3     Total Protein 6.5 - 8.1 g/dL 7.3   7.1   Total Bilirubin 0.3 - 1.2 mg/dL 1.1  1.0  1.1   Alkaline Phos 38 - 126 U/L 61   61   AST 15 - 41 U/L 17   14   ALT 0 - 44 U/L 15   11       ASSESSMENT & PLAN Aubriee L Dohmen 76 y.o. female with medical history significant for osteoarthritis and hypertension who presents for evaluation of elevated ferritin and increased MCV.   After review of the labs, review of the records, and discussion with the patient the patients findings are most consistent with elevated ferritin of unclear etiology.  Elevated serum ferritin levels have numerous possible etiologies. These include hereditary hemochromatosis (heterozygous or homozygous), inflammation, liver disease, or iron overload from an exogenous source. Hereditary hemochromatosis is a hereditary condition caused by mutations in the HFE gene, which regulates iron absorption. The most common genes mutated in this condition are the C282Y and H63D genes. Homozygous mutations represent a disease state which requires phlebotomy to decrease ferritin levels to a goal of <50  (Blood (2010) 116 (3): 317-325). The goal is to decrease ferritin so there is no deposition in critical organs (liver, heart, pancreas and thyroid). Heterozygous mutations (or compound heterozygotes) rarely require phlebotomy, but do have elevated serum iron/ferritin levels.  Ferritin is an acute phase reactant and can be elevated with systemic inflammation. Direct damage to liver tissue can also cause spillage of ferritin into the blood, resulting in elevated ferritin.  Additionally, serum iron levels can be quite transient and an elevation or serum iron may not represent a true overload of total body iron (best lab for this is ferritin).   #Elevated Iron/Ferritin #Macrocytosis without Anemia --labs to include CBC, CMP, LDH, ESR, CRP --will repeat  iron panel and ferritin today --will send for HFE gene mutation. If found to have homozygous mutation for HFE will begin phlebotomies every other week with goal ferritin <50  --US liver on 08/01/2021 showed no evidence of  liver disease --if patient confirmed to have hereditary  hemochromatosis will order TSH, TTE, and Hepatitis B/C panels.  --RTC pending results of above studies.   Orders Placed This Encounter  Procedures   CBC with Differential ([facility] Only)    Standing Status:   Future    Number of Occurrences:   1    Standing Expiration Date:   09/12/2022   CMP ([facility] only)    Standing Status:   Future    Number of Occurrences:   1    Standing Expiration Date:   09/12/2022   Ferritin    Standing Status:   Future    Number of Occurrences:   1    Standing Expiration Date:   09/12/2022   Iron and Iron Binding Capacity ([facility] only)    Standing Status:   Future    Number of Occurrences:   1    Standing Expiration Date:   09/12/2022   Sedimentation rate    Standing Status:   Future    Number of Occurrences:   1    Standing Expiration Date:   09/11/2022   C-reactive protein    Standing Status:   Future    Number of Occurrences:   1    Standing Expiration Date:   09/11/2022   HFE-Associated Hereditary Hemochromatosis (COHESION)    All questions were answered. The patient knows to call the clinic with any problems, questions or concerns.  A total of more than 60 minutes were spent on this encounter with face-to-face time and non-face-to-face time, including preparing to see the patient, ordering tests and/or medications, counseling the patient and coordination of care as outlined above.   Ledell Peoples, MD Department of Hematology/Oncology [place] at [facility] Phone: [phone] Pager: [phone] Email: [email or web address]  09/11/2021 4:45 PM

## 2021-09-12 ENCOUNTER — Other Ambulatory Visit: Payer: Self-pay | Admitting: Hematology and Oncology

## 2021-09-12 ENCOUNTER — Telehealth: Payer: Self-pay | Admitting: *Deleted

## 2021-09-12 DIAGNOSIS — R7989 Other specified abnormal findings of blood chemistry: Secondary | ICD-10-CM

## 2021-09-12 LAB — FERRITIN: Ferritin: 434 ng/mL — ABNORMAL HIGH (ref 11–307)

## 2021-09-12 NOTE — Telephone Encounter (Signed)
-----   Message from Orson Slick, MD sent at 09/12/2021  9:50 AM EDT ----- Please let Mrs. Miranda Holland know that her iron levels remain quite elevated.  Her enlarged red blood cells have decreased in size, potentially due to decrease in alcohol consumption.  Unfortunately yesterday the hemochromatosis testing was not collected (I am unsure why?).  Please have her scheduled for repeat lab visit in order to have this test collected.  I am sorry for the inconvenience.  ----- Message ----- From: Buel Ream, [facility] [address] Sent: 09/11/2021   2:32 PM EDT To: Orson Slick, MD

## 2021-09-12 NOTE — Telephone Encounter (Signed)
TCT patient regarding recent lab results. Reviewed them with her. Advised that Dr. Lorenso Courier still needs one lab that wasn't done yesterday Asked pt when she could come in  for this last blood test. She said she can come in tomorrow morning.  Appt made for 8am. Pt voiced understanding to the above.

## 2021-09-13 ENCOUNTER — Inpatient Hospital Stay: Payer: Medicare Other

## 2021-09-13 ENCOUNTER — Other Ambulatory Visit: Payer: Self-pay

## 2021-09-13 DIAGNOSIS — M1990 Unspecified osteoarthritis, unspecified site: Secondary | ICD-10-CM | POA: Diagnosis not present

## 2021-09-13 DIAGNOSIS — R7989 Other specified abnormal findings of blood chemistry: Secondary | ICD-10-CM | POA: Diagnosis not present

## 2021-09-13 DIAGNOSIS — Z800 Family history of malignant neoplasm of digestive organs: Secondary | ICD-10-CM | POA: Diagnosis not present

## 2021-09-13 DIAGNOSIS — Z808 Family history of malignant neoplasm of other organs or systems: Secondary | ICD-10-CM | POA: Diagnosis not present

## 2021-09-13 DIAGNOSIS — Z87891 Personal history of nicotine dependence: Secondary | ICD-10-CM | POA: Diagnosis not present

## 2021-09-13 DIAGNOSIS — I10 Essential (primary) hypertension: Secondary | ICD-10-CM | POA: Diagnosis not present

## 2021-09-18 DIAGNOSIS — M1712 Unilateral primary osteoarthritis, left knee: Secondary | ICD-10-CM | POA: Diagnosis not present

## 2021-09-20 LAB — HEMOCHROMATOSIS DNA-PCR(C282Y,H63D)

## 2021-09-25 ENCOUNTER — Telehealth: Payer: Self-pay | Admitting: *Deleted

## 2021-09-25 DIAGNOSIS — M1712 Unilateral primary osteoarthritis, left knee: Secondary | ICD-10-CM | POA: Diagnosis not present

## 2021-09-25 NOTE — Telephone Encounter (Signed)
Received call from patient inquiring about her recent lab results.  Labs done on 09/11/21 and 09/13/21.  Please advise.

## 2021-09-27 ENCOUNTER — Telehealth: Payer: Self-pay

## 2021-09-27 ENCOUNTER — Telehealth: Payer: Self-pay | Admitting: Hematology and Oncology

## 2021-09-27 DIAGNOSIS — R7989 Other specified abnormal findings of blood chemistry: Secondary | ICD-10-CM

## 2021-09-27 NOTE — Telephone Encounter (Signed)
Called Miranda Holland to discuss the results of her lab work.  Fortunately there is no evidence of hereditary hemochromatosis DNA panel.  Additionally her ferritin levels.  Trending down currently at 434.  Iron panel did not show clear evidence of iron overload.  Additionally inflammatory markers were negative.  At this time I strongly suspect that there is a transient etiology causing liver inflammation.  This would explain elevated bilirubin and the mildly increased MCV.  These have both normalized.  I would recommend the patient abstain from alcohol and she has self discontinued several medications including a statin drug.  We will be can do his repeat labs in approximate 3 months time to reevaluate and ensure that her levels are trending down.  Patient voiced understanding of the plan moving forward.  Miranda Peoples, MD Department of Hematology/Oncology [place] at [facility] Phone: [phone] Pager: [phone] Email: [email or web address]

## 2021-09-27 NOTE — Telephone Encounter (Signed)
Patient called requesting results from recent lab work.   Routed to provider.

## 2021-09-30 ENCOUNTER — Telehealth: Payer: Self-pay | Admitting: Hematology and Oncology

## 2021-09-30 NOTE — Telephone Encounter (Signed)
Contacted patient to scheduled appointments. Patient is aware of appointments that are scheduled.   

## 2021-10-02 DIAGNOSIS — M1712 Unilateral primary osteoarthritis, left knee: Secondary | ICD-10-CM | POA: Diagnosis not present

## 2021-11-12 DIAGNOSIS — H524 Presbyopia: Secondary | ICD-10-CM | POA: Diagnosis not present

## 2021-11-12 DIAGNOSIS — H52223 Regular astigmatism, bilateral: Secondary | ICD-10-CM | POA: Diagnosis not present

## 2021-11-12 DIAGNOSIS — H53143 Visual discomfort, bilateral: Secondary | ICD-10-CM | POA: Diagnosis not present

## 2021-11-12 DIAGNOSIS — H5213 Myopia, bilateral: Secondary | ICD-10-CM | POA: Diagnosis not present

## 2021-11-12 DIAGNOSIS — H2513 Age-related nuclear cataract, bilateral: Secondary | ICD-10-CM | POA: Diagnosis not present

## 2021-11-13 DIAGNOSIS — Z23 Encounter for immunization: Secondary | ICD-10-CM | POA: Diagnosis not present

## 2021-11-27 ENCOUNTER — Ambulatory Visit
Admission: RE | Admit: 2021-11-27 | Discharge: 2021-11-27 | Disposition: A | Discharge status: Home / Self Care | Payer: Medicare Other | Source: Ambulatory Visit | Attending: Internal Medicine | Admitting: Internal Medicine

## 2021-11-27 DIAGNOSIS — M85859 Other specified disorders of bone density and structure, unspecified thigh: Secondary | ICD-10-CM

## 2021-11-27 DIAGNOSIS — Z780 Asymptomatic menopausal state: Secondary | ICD-10-CM | POA: Diagnosis not present

## 2021-11-27 DIAGNOSIS — M8589 Other specified disorders of bone density and structure, multiple sites: Secondary | ICD-10-CM | POA: Diagnosis not present

## 2021-11-27 DIAGNOSIS — M810 Age-related osteoporosis without current pathological fracture: Secondary | ICD-10-CM | POA: Diagnosis not present

## 2021-11-27 NOTE — Progress Notes (Signed)
Pt was found to have osteoporosis. In 2017 pt was ostepenic so has since progressed. Have pt make appt to discuss medication options to keep this from progressing.

## 2021-12-02 ENCOUNTER — Ambulatory Visit (INDEPENDENT_AMBULATORY_CARE_PROVIDER_SITE_OTHER): Payer: Medicare Other | Admitting: Family

## 2021-12-02 ENCOUNTER — Encounter: Payer: Self-pay | Admitting: Family

## 2021-12-02 VITALS — BP 130/86 | HR 76 | Temp 98.7°F | Resp 16 | Ht 62.0 in | Wt 118.5 lb

## 2021-12-02 DIAGNOSIS — M816 Localized osteoporosis [Lequesne]: Secondary | ICD-10-CM | POA: Diagnosis not present

## 2021-12-02 DIAGNOSIS — E559 Vitamin D deficiency, unspecified: Secondary | ICD-10-CM | POA: Insufficient documentation

## 2021-12-02 DIAGNOSIS — R7989 Other specified abnormal findings of blood chemistry: Secondary | ICD-10-CM | POA: Diagnosis not present

## 2021-12-02 NOTE — Assessment & Plan Note (Signed)
Cont f/u with hematology as scheduled.

## 2021-12-02 NOTE — Assessment & Plan Note (Signed)
Long d/w pt on osteoporosis  Recommendation for fosamax however pt politely declines. She will start vit d calcium daily and work on weight bearing exercises, wants to wait for hemoc appt to be over prior to considering starting new med.  Compared 2017 dexa to current day and advise pt of progression of disease.

## 2021-12-02 NOTE — Progress Notes (Signed)
Established Patient Office Visit  Subjective:  Patient ID: Miranda Holland, female    DOB: 1945/09/24  Age: 76 y.o. MRN: [id number]  CC:  Chief Complaint  Patient presents with   Osteoporosis    HPI Miranda Holland is here today for follow up.   Bone density: worsened since bone density 9/23. She worries as she does have gerd currently which has slowly become more controlled in the last three years. No longer taking protonix due to potential d/e from ppi's. There has been progression with bone density from 2017. She is worried about gum disease from her 40's goes to dentist every three months so this worries her as well. She has tried taking calcium in the past, but she can not seem to tolerate it. Doesn't make her feel well.   Elevated ferritin: workup in progress right now, has f/u appt with Dr. Lorenso Courier  She has stopped drinking alcohol she states by 95% since she last saw hematology 09/11/21.   Past Medical History:  Diagnosis Date   Arthritis    OSTEO   Hypertension    STD (sexually transmitted disease)    HSV 1    Past Surgical History:  Procedure Laterality Date    submucous myoma, resection  1997   BLADDER REPAIR  2007   with mesh, from child bearing   BREAST EXCISIONAL BIOPSY Left 1987   benign   BREAST SURGERY  1987   Lt Breast Bx Benign   THYROIDECTOMY  1991   hemi(left)   TOE FUSION  01/2012   LEFT Big Toe   TUBAL LIGATION      Family History  Problem Relation Age of Onset   Diabetes Mother    Heart disease Mother    Cirrhosis Father    Alcoholism Father    Hypertension Sister    Heart disease Brother    Breast cancer Paternal Aunt     Social History   Socioeconomic History   Marital status: Married    Spouse name: Not on file   Number of children: 2   Years of education: Not on file   Highest education level: Not on file  Occupational History   Occupation: retired Marine scientist  Tobacco Use   Smoking status: Never   Smokeless tobacco: Never   Vaping Use   Vaping Use: Never used  Substance and Sexual Activity   Alcohol use: Not Currently    Alcohol/week: 21.0 standard drinks of alcohol    Types: 14 Glasses of wine, 7 Standard drinks or equivalent per week    Comment: however stopped drinking may 1   Drug use: No   Sexual activity: Yes    Partners: Male    Birth control/protection: Post-menopausal  Other Topics Concern   Not on file  Social History Narrative   Two girls   Social Determinants of Health   Financial Resource Strain: Not on file  Food Insecurity: Not on file  Transportation Needs: Not on file  Physical Activity: Not on file  Stress: Not on file  Social Connections: Not on file  Intimate Partner Violence: Not on file    Outpatient Medications Prior to Visit  Medication Sig Dispense Refill   ALPRAZolam (XANAX) 0.25 MG tablet Take 1 tablet (0.25 mg total) by mouth as needed for sleep. 30 tablet 1   famotidine (PEPCID) 20 MG tablet 1 tablet at bedtime as needed     Multiple Vitamin (MULTI-VITAMIN PO) Take by mouth daily.     valACYclovir (  VALTREX) 1000 MG tablet Take 1 tablet (1,000 mg total) by mouth 2 (two) times daily. 30 tablet 2   cetirizine (ZYRTEC ALLERGY) 10 MG tablet 1 tablet (Patient not taking: Reported on 12/02/2021)     pravastatin (PRAVACHOL) 20 MG tablet 1 tablet (Patient not taking: Reported on 12/02/2021)     No facility-administered medications prior to visit.    Allergies  Allergen Reactions   Meperidine Hcl Other (See Comments)   Oxycodone Hcl Other (See Comments)        Objective:    Physical Exam Constitutional:      General: She is not in acute distress.    Appearance: Normal appearance. She is normal weight. She is not ill-appearing, toxic-appearing or diaphoretic.  Cardiovascular:     Rate and Rhythm: Normal rate and regular rhythm.  Pulmonary:     Effort: Pulmonary effort is normal.  Neurological:     General: No focal deficit present.     Mental Status: She is  alert and oriented to person, place, and time. Mental status is at baseline.  Psychiatric:        Mood and Affect: Mood normal.        Behavior: Behavior normal.        Thought Content: Thought content normal.        Judgment: Judgment normal.       BP 130/86   Pulse 76   Temp 98.7 F (37.1 C)   Resp 16   Ht '5\' 2"'$  (1.575 m)   Wt 118 lb 8 oz (53.8 kg)   SpO2 99%   BMI 21.67 kg/m  Wt Readings from Last 3 Encounters:  12/02/21 118 lb 8 oz (53.8 kg)  09/11/21 120 lb (54.4 kg)  07/23/21 119 lb 4 oz (54.1 kg)     Health Maintenance Due  Topic Date Due   Hepatitis C Screening  Never done   TETANUS/TDAP  Never done   Zoster Vaccines- Shingrix (1 of 2) Never done   Pneumonia Vaccine 84+ Years old (1 - PCV) Never done   COVID-19 Vaccine (3 - Pfizer series) 05/08/2019   Medicare Annual Wellness (AWV)  05/21/2021    There are no preventive care reminders to display for this patient.  No results found for: "TSH" Lab Results  Component Value Date   WBC 7.2 09/11/2021   HGB 12.7 09/11/2021   HCT 33.9 (L) 09/11/2021   MCV 98.5 09/11/2021   PLT 265 09/11/2021   Lab Results  Component Value Date   NA 140 09/11/2021   K 4.0 09/11/2021   CO2 25 09/11/2021   GLUCOSE 116 (H) 09/11/2021   BUN 21 09/11/2021   CREATININE 0.84 09/11/2021   BILITOT 1.1 09/11/2021   ALKPHOS 61 09/11/2021   AST 17 09/11/2021   ALT 15 09/11/2021   PROT 7.3 09/11/2021   ALBUMIN 4.6 09/11/2021   CALCIUM 9.3 09/11/2021   ANIONGAP 9 09/11/2021   No results found for: "CHOL" No results found for: "HDL" No results found for: "LDLCALC" No results found for: "TRIG" No results found for: "CHOLHDL" No results found for: "HGBA1C"    Assessment & Plan:   Problem List Items Addressed This Visit       Musculoskeletal and Integument   Localized osteoporosis without current pathological fracture    Long d/w pt on osteoporosis  Recommendation for fosamax however pt politely declines. She will  start vit d calcium daily and work on weight bearing exercises, wants to wait for hemoc  appt to be over prior to considering starting new med.  Compared 2017 dexa to current day and advise pt of progression of disease.         Other   Vitamin D deficiency - Primary   Elevated ferritin    Cont f/u with hematology as scheduled.       No orders of the defined types were placed in this encounter.   Follow-up: No follow-ups on file.    Eugenia Pancoast, FNP

## 2021-12-24 ENCOUNTER — Inpatient Hospital Stay: Payer: Medicare Other | Attending: Hematology and Oncology

## 2021-12-24 ENCOUNTER — Other Ambulatory Visit: Payer: Self-pay

## 2021-12-24 DIAGNOSIS — D7589 Other specified diseases of blood and blood-forming organs: Secondary | ICD-10-CM | POA: Diagnosis not present

## 2021-12-24 DIAGNOSIS — R7989 Other specified abnormal findings of blood chemistry: Secondary | ICD-10-CM | POA: Insufficient documentation

## 2021-12-24 LAB — CMP (CANCER CENTER ONLY)
ALT: 10 U/L (ref 0–44)
AST: 14 U/L — ABNORMAL LOW (ref 15–41)
Albumin: 4.8 g/dL (ref 3.5–5.0)
Alkaline Phosphatase: 62 U/L (ref 38–126)
Anion gap: 6 (ref 5–15)
BUN: 14 mg/dL (ref 8–23)
CO2: 28 mmol/L (ref 22–32)
Calcium: 9.7 mg/dL (ref 8.9–10.3)
Chloride: 104 mmol/L (ref 98–111)
Creatinine: 0.72 mg/dL (ref 0.44–1.00)
GFR, Estimated: >60 mL/min (ref >60)
Glucose, Bld: 112 mg/dL — ABNORMAL HIGH (ref 70–99)
Potassium: 4.2 mmol/L (ref 3.5–5.1)
Sodium: 138 mmol/L (ref 135–145)
Total Bilirubin: 1.2 mg/dL (ref 0.3–1.2)
Total Protein: 7.3 g/dL (ref 6.5–8.1)

## 2021-12-24 LAB — CBC WITH DIFFERENTIAL (CANCER CENTER ONLY)
Abs Immature Granulocytes: 0.04 10*3/uL (ref 0.00–0.07)
Basophils Absolute: 0.1 10*3/uL (ref 0.0–0.1)
Basophils Relative: 1 %
Eosinophils Absolute: 0.2 10*3/uL (ref 0.0–0.5)
Eosinophils Relative: 3 %
HCT: 34.5 % — ABNORMAL LOW (ref 36.0–46.0)
Hemoglobin: 12.7 g/dL (ref 12.0–15.0)
Immature Granulocytes: 1 %
Lymphocytes Relative: 32 %
Lymphs Abs: 2.2 10*3/uL (ref 0.7–4.0)
MCH: 36.9 pg — ABNORMAL HIGH (ref 26.0–34.0)
MCHC: 36.8 g/dL — ABNORMAL HIGH (ref 30.0–36.0)
MCV: 100.3 fL — ABNORMAL HIGH (ref 80.0–100.0)
Monocytes Absolute: 0.5 10*3/uL (ref 0.1–1.0)
Monocytes Relative: 7 %
Neutro Abs: 3.9 10*3/uL (ref 1.7–7.7)
Neutrophils Relative %: 56 %
Platelet Count: 268 10*3/uL (ref 150–400)
RBC: 3.44 MIL/uL — ABNORMAL LOW (ref 3.87–5.11)
RDW: 15.9 % — ABNORMAL HIGH (ref 11.5–15.5)
WBC Count: 6.9 10*3/uL (ref 4.0–10.5)
nRBC: 0 % (ref 0.0–0.2)

## 2021-12-24 LAB — IRON AND IRON BINDING CAPACITY (CC-WL,HP ONLY)
Iron: 90 ug/dL (ref 28–170)
Saturation Ratios: 28 % (ref 10.4–31.8)
TIBC: 323 ug/dL (ref 250–450)
UIBC: 233 ug/dL (ref 148–442)

## 2021-12-24 LAB — FERRITIN: Ferritin: 478 ng/mL — ABNORMAL HIGH (ref 11–307)

## 2022-01-06 ENCOUNTER — Telehealth: Payer: Self-pay | Admitting: *Deleted

## 2022-01-06 NOTE — Telephone Encounter (Signed)
Received vm message from pt inquiring about her lab results from 12/24/21  Please advise

## 2022-01-08 ENCOUNTER — Telehealth: Payer: Self-pay | Admitting: *Deleted

## 2022-01-08 NOTE — Telephone Encounter (Signed)
TCT patient regarding recent lab results.  Advised that her ferritin levels remain elevated at 478.  Her other iron panel appears with normal limits and she does not have elevated inflammatory markers.  At this time, Dr. Lorenso Courier still suspects that the liver is the cause of her elevated ferritin. Pt did voice concern that all her liver tests and imaging is normal so she doesn't really understand how it could be her liver. She has stopped drinking alcohol. She also is asking if medications like fosamax would affect her Ferritin count. Her PCP would like to start her on something for her osteoporosis.  Please advise.  Pt is agreeable to to labs in 3 months

## 2022-01-09 ENCOUNTER — Telehealth: Payer: Self-pay | Admitting: *Deleted

## 2022-01-09 NOTE — Telephone Encounter (Signed)
TCT patient. Spoke with her. Advised per Dr. Lorenso Courier "Osteoporosis medications should not cause an elevation in ferritin. OK to treat per PCP. We will continue to assess with repeat studies in 3 months. "  Pt voiced understanding.

## 2022-01-14 ENCOUNTER — Ambulatory Visit: Payer: Medicare Other | Admitting: Family

## 2022-01-22 DIAGNOSIS — D1801 Hemangioma of skin and subcutaneous tissue: Secondary | ICD-10-CM | POA: Diagnosis not present

## 2022-01-22 DIAGNOSIS — D692 Other nonthrombocytopenic purpura: Secondary | ICD-10-CM | POA: Diagnosis not present

## 2022-01-22 DIAGNOSIS — L814 Other melanin hyperpigmentation: Secondary | ICD-10-CM | POA: Diagnosis not present

## 2022-01-22 DIAGNOSIS — L821 Other seborrheic keratosis: Secondary | ICD-10-CM | POA: Diagnosis not present

## 2022-01-22 DIAGNOSIS — D225 Melanocytic nevi of trunk: Secondary | ICD-10-CM | POA: Diagnosis not present

## 2022-01-30 DIAGNOSIS — H35372 Puckering of macula, left eye: Secondary | ICD-10-CM | POA: Diagnosis not present

## 2022-01-30 DIAGNOSIS — H2513 Age-related nuclear cataract, bilateral: Secondary | ICD-10-CM | POA: Diagnosis not present

## 2022-01-30 DIAGNOSIS — H2512 Age-related nuclear cataract, left eye: Secondary | ICD-10-CM | POA: Diagnosis not present

## 2022-01-30 DIAGNOSIS — H18413 Arcus senilis, bilateral: Secondary | ICD-10-CM | POA: Diagnosis not present

## 2022-01-30 DIAGNOSIS — H25043 Posterior subcapsular polar age-related cataract, bilateral: Secondary | ICD-10-CM | POA: Diagnosis not present

## 2022-02-05 ENCOUNTER — Ambulatory Visit: Payer: Medicare Other | Admitting: Family

## 2022-02-06 DIAGNOSIS — H35033 Hypertensive retinopathy, bilateral: Secondary | ICD-10-CM | POA: Diagnosis not present

## 2022-02-06 DIAGNOSIS — H25813 Combined forms of age-related cataract, bilateral: Secondary | ICD-10-CM | POA: Diagnosis not present

## 2022-02-06 DIAGNOSIS — H43813 Vitreous degeneration, bilateral: Secondary | ICD-10-CM | POA: Diagnosis not present

## 2022-02-06 DIAGNOSIS — H35342 Macular cyst, hole, or pseudohole, left eye: Secondary | ICD-10-CM | POA: Diagnosis not present

## 2022-02-06 DIAGNOSIS — H35373 Puckering of macula, bilateral: Secondary | ICD-10-CM | POA: Diagnosis not present

## 2022-02-11 ENCOUNTER — Encounter: Payer: Self-pay | Admitting: Family

## 2022-02-11 ENCOUNTER — Ambulatory Visit (INDEPENDENT_AMBULATORY_CARE_PROVIDER_SITE_OTHER): Payer: Medicare Other | Admitting: Family

## 2022-02-11 VITALS — BP 118/78 | HR 76 | Temp 97.1°F | Ht 62.0 in | Wt 119.2 lb

## 2022-02-11 DIAGNOSIS — R7989 Other specified abnormal findings of blood chemistry: Secondary | ICD-10-CM | POA: Diagnosis not present

## 2022-02-11 DIAGNOSIS — E559 Vitamin D deficiency, unspecified: Secondary | ICD-10-CM | POA: Diagnosis not present

## 2022-02-11 DIAGNOSIS — F411 Generalized anxiety disorder: Secondary | ICD-10-CM

## 2022-02-11 DIAGNOSIS — R739 Hyperglycemia, unspecified: Secondary | ICD-10-CM

## 2022-02-11 DIAGNOSIS — E782 Mixed hyperlipidemia: Secondary | ICD-10-CM

## 2022-02-11 DIAGNOSIS — M816 Localized osteoporosis [Lequesne]: Secondary | ICD-10-CM

## 2022-02-11 LAB — LIPID PANEL
Cholesterol: 254 mg/dL — ABNORMAL HIGH (ref 0–200)
HDL: 95.7 mg/dL (ref >39.00)
LDL Cholesterol: 144 mg/dL — ABNORMAL HIGH (ref 0–99)
NonHDL: 158.28
Total CHOL/HDL Ratio: 3
Triglycerides: 70 mg/dL (ref 0.0–149.0)
VLDL: 14 mg/dL (ref 0.0–40.0)

## 2022-02-11 LAB — HEMOGLOBIN A1C: Hgb A1c MFr Bld: 4.6 % (ref 4.6–6.5)

## 2022-02-11 LAB — VITAMIN D 25 HYDROXY (VIT D DEFICIENCY, FRACTURES): VITD: 38.83 ng/mL (ref 30.00–100.00)

## 2022-02-11 MED ORDER — ALPRAZOLAM 0.25 MG PO TABS: 0.2500 mg | ORAL_TABLET | ORAL | 2 refills | Status: DC | PRN

## 2022-02-11 NOTE — Assessment & Plan Note (Signed)
Long d/w pt in regards to this, reviewed dexa again.  Pt declines fosamax at this time, advised pt repeat dexa in two years, continue with daily vitamin d and calcium and weight bearing exercises.

## 2022-02-11 NOTE — Progress Notes (Signed)
Established Patient Office Visit  Subjective:  Patient ID: Miranda Holland, female    DOB: 03/30/1945  Age: 77 y.o. MRN: [id number]  CC:  Chief Complaint  Patient presents with   Follow-up    6 months and labs    HPI [name] is here today for follow up.   Pt is with acute concerns.  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 Elevated ferritin however pt is seeing hematology for this.   Lab Results  Component Value Date   WBC 6.9 12/24/2021   HGB 12.7 12/24/2021   HCT 34.5 (L) 12/24/2021   MCV 100.3 (H) 12/24/2021   PLT 268 12/24/2021   Osteoporosis: bone density 11/27/21 with decline in all areas. She has started and is taking calcium vitamin d , started 500 mg twice daily and daily vitamin D 2000 IU.   Alcohol intake, a glass or two of wine nightly prior to July . Cut down to glass of wine every two weeks since July 2024.   Elevated ferritin: has been off of statin since July 2023. She went off of this because she was worried it could be affecting her liver. She is going to get her ferritin repeated in feb and will have f/u with hematology ten. Pt does state about three years ago tested for hepatitis which was negative. Fractionated bilirubin 07/23/21 wnl, hepatic function panel wnl. U/s abdomen unrevealing for acute concerns 08/01/21.   Past Medical History:  Diagnosis Date   Arthritis    OSTEO   Hypertension    STD (sexually transmitted disease)    HSV 1    Past Surgical History:  Procedure Laterality Date    submucous myoma,  resection  1997   BLADDER REPAIR  2007   with mesh, from child bearing   BREAST EXCISIONAL BIOPSY Left 1987   benign   BREAST SURGERY  1987   Lt Breast Bx Benign   THYROIDECTOMY  1991   hemi(left)   TOE FUSION  01/2012   LEFT Big Toe   TUBAL LIGATION      Family History  Problem Relation Age of Onset   Diabetes Mother    Heart disease Mother    Cirrhosis Father    Alcoholism Father    Hypertension Sister    Heart disease Brother    Breast cancer Paternal Aunt     Social History   Socioeconomic History   Marital status: Married    Spouse name: Not on file   Number of children: 2   Years of education: Not on file   Highest education level: Not on file  Occupational History   Occupation: retired Marine scientist  Tobacco Use   Smoking status: Never   Smokeless tobacco: Never  Vaping Use   Vaping Use: Never used  Substance and Sexual Activity   Alcohol use: Not Currently    Alcohol/week: 21.0 standard drinks of alcohol    Types: 14 Glasses of wine,  7 Standard drinks or equivalent per week    Comment: however stopped drinking may 1   Drug use: No   Sexual activity: Yes    Partners: Male    Birth control/protection: Post-menopausal  Other Topics Concern   Not on file  Social History Narrative   Two girls   Social Determinants of Health   Financial Resource Strain: Not on file  Food Insecurity: Not on file  Transportation Needs: Not on file  Physical Activity: Not on file  Stress: Not on file  Social Connections: Not on file  Intimate Partner Violence: Not on file    Outpatient Medications Prior to Visit  Medication Sig Dispense Refill   Calcium-Magnesium-Vitamin D 600-40-500 MG-MG-UNIT TB24 Take by mouth. Take one by mouth twice a day     cholecalciferol (VITAMIN D3) 25 MCG (1000 UNIT) tablet Take 1,000 Units by mouth daily.     famotidine (PEPCID) 20 MG tablet 1 tablet at bedtime as needed     valACYclovir (VALTREX) 1000 MG tablet Take 1 tablet (1,000 mg total)  by mouth 2 (two) times daily. 30 tablet 2   ALPRAZolam (XANAX) 0.25 MG tablet Take 1 tablet (0.25 mg total) by mouth as needed for sleep. 30 tablet 1   Multiple Vitamin (MULTI-VITAMIN PO) Take by mouth daily. (Patient not taking: Reported on 02/11/2022)     No facility-administered medications prior to visit.    Allergies  Allergen Reactions   Meperidine Hcl Other (See Comments)   Oxycodone Hcl Other (See Comments)      Review of Systems  Respiratory:  Negative for shortness of breath.   Cardiovascular:  Negative for chest pain and palpitations.  Gastrointestinal:  Negative for constipation and diarrhea.  Genitourinary:  Negative for dysuria, frequency and urgency.  Musculoskeletal:  Negative for myalgias.  Psychiatric/Behavioral:  Negative for depression and suicidal ideas.   All other systems reviewed and are negative.    Objective:    Physical Exam Constitutional:      Appearance: Normal appearance.  Cardiovascular:     Rate and Rhythm: Normal rate and regular rhythm.  Pulmonary:     Effort: Pulmonary effort is normal.  Musculoskeletal:     Right lower leg: No edema.     Left lower leg: No edema.  Neurological:     General: No focal deficit present.     Mental Status: She is alert and oriented to person, place, and time. Mental status is at baseline.  Psychiatric:        Mood and Affect: Mood normal.        Behavior: Behavior normal.        Thought Content: Thought content normal.        Judgment: Judgment normal.       BP 118/78 (BP Location: Left Arm, Patient Position: Sitting)   Pulse 76   Temp (!) 97.1 F (36.2 C) (Skin)   Ht '5\' 2"'$  (1.575 m)   Wt 119 lb 4 oz (54.1 kg)   SpO2 100%   BMI 21.81 kg/m  Wt Readings from Last 3 Encounters:  02/11/22 119 lb 4 oz (54.1 kg)  12/02/21 118 lb 8 oz (53.8 kg)  09/11/21 120 lb (54.4 kg)     Health Maintenance Due  Topic Date Due   Hepatitis C Screening  Never done   DTaP/Tdap/Td (1 - Tdap) Never done    Zoster Vaccines- Shingrix (1 of 2) Never done   Pneumonia Vaccine 39+ Years old (1 - PCV) Never  done   Medicare Annual Wellness (AWV)  05/21/2021   COVID-19 Vaccine (3 - 2023-24 season) 10/04/2021    There are no preventive care reminders to display for this patient.  No results found for: "TSH" Lab Results  Component Value Date   WBC 6.9 12/24/2021   HGB 12.7 12/24/2021   HCT 34.5 (L) 12/24/2021   MCV 100.3 (H) 12/24/2021   PLT 268 12/24/2021   Lab Results  Component Value Date   NA 138 12/24/2021   K 4.2 12/24/2021   CO2 28 12/24/2021   GLUCOSE 112 (H) 12/24/2021   BUN 14 12/24/2021   CREATININE 0.72 12/24/2021   BILITOT 1.2 12/24/2021   ALKPHOS 62 12/24/2021   AST 14 (L) 12/24/2021   ALT 10 12/24/2021   PROT 7.3 12/24/2021   ALBUMIN 4.8 12/24/2021   CALCIUM 9.7 12/24/2021   ANIONGAP 6 12/24/2021   No results found for: "CHOL" No results found for: "HDL" No results found for: "LDLCALC" No results found for: "TRIG" No results found for: "CHOLHDL" No results found for: "HGBA1C"    Assessment & Plan:   Problem List Items Addressed This Visit       Musculoskeletal and Integument   Localized osteoporosis without current pathological fracture    Long d/w pt in regards to this, reviewed dexa again.  Pt declines fosamax at this time, advised pt repeat dexa in two years, continue with daily vitamin d and calcium and weight bearing exercises.       Relevant Medications   Calcium-Magnesium-Vitamin D 600-40-500 MG-MG-UNIT TB24   cholecalciferol (VITAMIN D3) 25 MCG (1000 UNIT) tablet     Other   Anxiety state    Pdmp reviewed Refill sent in for xanax 0.25 mg once daily prn       Relevant Medications   ALPRAZolam (XANAX) 0.25 MG tablet   Mixed hyperlipidemia - Primary    Pt is fasting.  Ordered lipid panel, pending results. Work on low cholesterol diet and exercise as tolerated Will consider restart pravastatin if needed pending results.       Relevant  Orders   Lipid panel   Vitamin D deficiency    Ordered vitamin d pending results.  Continue daily supplementation       Relevant Orders   VITAMIN D 25 Hydroxy (Vit-D Deficiency, Fractures)   Elevated ferritin    Reviewed. Cont f/u with oncologist. Possible CT abdomen being next order to assess liver function however will await assessment by hematologist.        Hyperglycemia    A1c ordered today pending results.       Relevant Orders   Hemoglobin A1c    Meds ordered this encounter  Medications   ALPRAZolam (XANAX) 0.25 MG tablet    Sig: Take 1 tablet (0.25 mg total) by mouth as needed for sleep or anxiety.    Dispense:  30 tablet    Refill:  2    Order Specific Question:   Supervising Provider    Answer:   Diona Browner, AMY E [[id number]]    Follow-up: Return in about 6 months (around 08/12/2022) for f/u annual wellness exam .    Eugenia Pancoast, FNP

## 2022-02-11 NOTE — Assessment & Plan Note (Signed)
Reviewed. Cont f/u with oncologist. Possible CT abdomen being next order to assess liver function however will await assessment by hematologist.

## 2022-02-11 NOTE — Assessment & Plan Note (Signed)
Pt is fasting.  Ordered lipid panel, pending results. Work on low cholesterol diet and exercise as tolerated Will consider restart pravastatin if needed pending results.

## 2022-02-11 NOTE — Assessment & Plan Note (Signed)
Ordered vitamin d pending results.  Continue daily supplementation

## 2022-02-11 NOTE — Assessment & Plan Note (Signed)
Pdmp reviewed Refill sent in for xanax 0.25 mg once daily prn

## 2022-02-11 NOTE — Assessment & Plan Note (Signed)
A1c ordered today pending results.

## 2022-02-12 ENCOUNTER — Encounter: Payer: Self-pay | Admitting: Family

## 2022-02-12 DIAGNOSIS — F411 Generalized anxiety disorder: Secondary | ICD-10-CM

## 2022-02-12 DIAGNOSIS — R739 Hyperglycemia, unspecified: Secondary | ICD-10-CM

## 2022-02-12 DIAGNOSIS — E782 Mixed hyperlipidemia: Secondary | ICD-10-CM

## 2022-02-12 MED ORDER — ALPRAZOLAM 0.25 MG PO TABS: 0.2500 mg | ORAL_TABLET | ORAL | 2 refills | Status: DC | PRN

## 2022-02-12 MED ORDER — PRAVASTATIN SODIUM 20 MG PO TABS: 20.0000 mg | ORAL_TABLET | Freq: Every day | ORAL | 1 refills | Status: DC

## 2022-03-03 ENCOUNTER — Encounter: Payer: Self-pay | Admitting: Family Medicine

## 2022-03-03 ENCOUNTER — Ambulatory Visit (INDEPENDENT_AMBULATORY_CARE_PROVIDER_SITE_OTHER): Payer: Medicare Other | Admitting: Family Medicine

## 2022-03-03 ENCOUNTER — Telehealth: Payer: Self-pay | Admitting: Family

## 2022-03-03 VITALS — BP 140/80 | HR 58 | Temp 97.5°F | Ht 62.0 in | Wt 122.5 lb

## 2022-03-03 DIAGNOSIS — R062 Wheezing: Secondary | ICD-10-CM

## 2022-03-03 DIAGNOSIS — J069 Acute upper respiratory infection, unspecified: Secondary | ICD-10-CM | POA: Diagnosis not present

## 2022-03-03 MED ORDER — ALBUTEROL SULFATE HFA 108 (90 BASE) MCG/ACT IN AERS: 2.0000 | INHALATION_SPRAY | Freq: Four times a day (QID) | RESPIRATORY_TRACT | 0 refills | Status: DC | PRN

## 2022-03-03 NOTE — Telephone Encounter (Signed)
Called patient back and scheduled her for 6 month follow up as [place] requested.

## 2022-03-03 NOTE — Progress Notes (Signed)
Miranda Lashomb T. Ruddy Swire, MD, [place] at [facility] [place], [address]  Phone: [phone]  FAX: [place] - 77 y.o. female  MRN [id number]  Date of Birth: 1945-03-22  Date: 03/03/2022  PCP: Eugenia Pancoast, FNP  Referral: Eugenia Pancoast, FNP  Chief Complaint  Patient presents with   Cough    X 1 week.  Got back from a Cruise on Friday.  Testing Negative for Covid on Saturday   Wheezing        Headache    For a couple of days that went away   Subjective:   Miranda Holland is a 77 y.o. very pleasant female patient with Body mass index is 22.41 kg/m. who presents with the following:  Patient presents with wheezing, low-grade fever, and congestion.  Started not feeling all that great when she was [place]  Felt like a scrathy throat.  Did not feel like it is in her chest.  Stayed in the cabin for a couple of days.   Started to feel better, and then the cough has gotten worse.   No fever, pulse ox was normal.    Wheezing in the morning.  Sleeps fine all night.  Robitussin, and then she can sleep.   She did have a headache, she denies any sore throat.  No nausea, vomiting, diarrhea.  Afebrile throughout.  Review of Systems is noted in the HPI, as appropriate  Objective:   BP (!) 140/80   Pulse (!) 58   Temp (!) 97.5 F (36.4 C) (Temporal)   Ht '5\' 2"'$  (1.575 m)   Wt 122 lb 8 oz (55.6 kg)   SpO2 100%   BMI 22.41 kg/m    Gen: WDWN, NAD. Globally Non-toxic HEENT: Throat clear, w/o exudate, R TM clear, L TM - good landmarks, No fluid present. Mild rhinnorhea.  MMM Frontal sinuses: NT Max sinuses: NT NECK: Anterior cervical  LAD is absent CV: RRR, No M/G/R, cap refill <2 sec PULM: Breathing comfortably in no respiratory distress. Rare faint wheezing, crackles, rhonchi   Laboratory and Imaging Data:  Assessment and Plan:     ICD-10-CM   1. Viral URI  J06.9      2. Wheezing  R06.2      Exam and history is consistent with an acute viral URI.  She is having some occasional wheezing, so I think using some albuterol is also reasonable.  Otherwise continue supportive care with Robitussin DM and she also has some Tessalon Perles.  Medication Management during today's office visit: Meds ordered this encounter  Medications   albuterol (VENTOLIN HFA) 108 (90 Base) MCG/ACT inhaler    Sig: Inhale 2 puffs into the lungs every 6 (six) hours as needed for wheezing or shortness of breath.    Dispense:  8 g    Refill:  0   There are no discontinued medications.  Orders placed today for conditions managed today: No orders of the defined types were placed in this encounter.   Disposition: No follow-ups on file.  Dragon [employer] One speech-to-text software was used for transcription in this dictation.  Possible transcriptional errors can occur using Editor, commissioning.   Signed,  [name]. Powell Halbert, MD   Outpatient Encounter Medications as of 03/03/2022  Medication Sig   albuterol (VENTOLIN HFA) 108 (90 Base) MCG/ACT inhaler Inhale 2 puffs into the lungs every 6 (six) hours as needed for  wheezing or shortness of breath.   ALPRAZolam (XANAX) 0.25 MG tablet Take 1 tablet (0.25 mg total) by mouth as needed for sleep or anxiety.   Calcium-Magnesium-Vitamin D 219-47-125 MG-MG-UNIT TB24 Take by mouth. Take one by mouth twice a day   cholecalciferol (VITAMIN D3) 25 MCG (1000 UNIT) tablet Take 1,000 Units by mouth daily.   famotidine (PEPCID) 20 MG tablet 1 tablet at bedtime as needed   pravastatin (PRAVACHOL) 20 MG tablet Take 1 tablet (20 mg total) by mouth daily.   valACYclovir (VALTREX) 1000 MG tablet Take 1 tablet (1,000 mg total) by mouth 2 (two) times daily.   No facility-administered encounter medications on file as of 03/03/2022.

## 2022-03-03 NOTE — Telephone Encounter (Signed)
Patient would like a call back regarding lab results call form 02/19/2022

## 2022-03-13 ENCOUNTER — Other Ambulatory Visit: Payer: Self-pay | Admitting: Family

## 2022-03-13 DIAGNOSIS — Z1231 Encounter for screening mammogram for malignant neoplasm of breast: Secondary | ICD-10-CM

## 2022-03-30 ENCOUNTER — Other Ambulatory Visit: Payer: Self-pay | Admitting: Family Medicine

## 2022-04-09 ENCOUNTER — Other Ambulatory Visit: Payer: Self-pay | Admitting: *Deleted

## 2022-04-09 DIAGNOSIS — R7989 Other specified abnormal findings of blood chemistry: Secondary | ICD-10-CM

## 2022-04-10 ENCOUNTER — Inpatient Hospital Stay: Payer: Medicare Other | Attending: Nurse Practitioner

## 2022-04-10 DIAGNOSIS — R7989 Other specified abnormal findings of blood chemistry: Secondary | ICD-10-CM | POA: Insufficient documentation

## 2022-04-10 DIAGNOSIS — D7589 Other specified diseases of blood and blood-forming organs: Secondary | ICD-10-CM | POA: Diagnosis not present

## 2022-04-10 LAB — CBC WITH DIFFERENTIAL (CANCER CENTER ONLY)
Abs Immature Granulocytes: 0.02 10*3/uL (ref 0.00–0.07)
Basophils Absolute: 0.1 10*3/uL (ref 0.0–0.1)
Basophils Relative: 2 %
Eosinophils Absolute: 0.2 10*3/uL (ref 0.0–0.5)
Eosinophils Relative: 4 %
HCT: 34.1 % — ABNORMAL LOW (ref 36.0–46.0)
Hemoglobin: 12.7 g/dL (ref 12.0–15.0)
Immature Granulocytes: 0 %
Lymphocytes Relative: 37 %
Lymphs Abs: 1.9 10*3/uL (ref 0.7–4.0)
MCH: 37.1 pg — ABNORMAL HIGH (ref 26.0–34.0)
MCHC: 37.2 g/dL — ABNORMAL HIGH (ref 30.0–36.0)
MCV: 99.7 fL (ref 80.0–100.0)
Monocytes Absolute: 0.4 10*3/uL (ref 0.1–1.0)
Monocytes Relative: 8 %
Neutro Abs: 2.6 10*3/uL (ref 1.7–7.7)
Neutrophils Relative %: 49 %
Platelet Count: 244 10*3/uL (ref 150–400)
RBC: 3.42 MIL/uL — ABNORMAL LOW (ref 3.87–5.11)
RDW: 16.1 % — ABNORMAL HIGH (ref 11.5–15.5)
WBC Count: 5.2 10*3/uL (ref 4.0–10.5)
nRBC: 0 % (ref 0.0–0.2)

## 2022-04-10 LAB — CMP (CANCER CENTER ONLY)
ALT: 10 U/L (ref 0–44)
AST: 14 U/L — ABNORMAL LOW (ref 15–41)
Albumin: 4.5 g/dL (ref 3.5–5.0)
Alkaline Phosphatase: 58 U/L (ref 38–126)
Anion gap: 6 (ref 5–15)
BUN: 15 mg/dL (ref 8–23)
CO2: 28 mmol/L (ref 22–32)
Calcium: 9.2 mg/dL (ref 8.9–10.3)
Chloride: 106 mmol/L (ref 98–111)
Creatinine: 0.72 mg/dL (ref 0.44–1.00)
GFR, Estimated: >60 mL/min (ref >60)
Glucose, Bld: 96 mg/dL (ref 70–99)
Potassium: 4.3 mmol/L (ref 3.5–5.1)
Sodium: 140 mmol/L (ref 135–145)
Total Bilirubin: 1.5 mg/dL — ABNORMAL HIGH (ref 0.3–1.2)
Total Protein: 7.1 g/dL (ref 6.5–8.1)

## 2022-04-10 LAB — IRON AND IRON BINDING CAPACITY (CC-WL,HP ONLY)
Iron: 251 ug/dL — ABNORMAL HIGH (ref 28–170)
Saturation Ratios: 79 % — ABNORMAL HIGH (ref 10.4–31.8)
TIBC: 318 ug/dL (ref 250–450)
UIBC: 67 ug/dL — ABNORMAL LOW (ref 148–442)

## 2022-04-10 LAB — FERRITIN: Ferritin: 527 ng/mL — ABNORMAL HIGH (ref 11–307)

## 2022-04-23 ENCOUNTER — Encounter: Payer: Self-pay | Admitting: Hematology and Oncology

## 2022-04-29 ENCOUNTER — Telehealth: Payer: Self-pay | Admitting: Hematology and Oncology

## 2022-04-29 NOTE — Telephone Encounter (Signed)
Per 3/25 Ib reached out to patient to schedule; left voicemail.

## 2022-05-01 ENCOUNTER — Ambulatory Visit
Admission: RE | Admit: 2022-05-01 | Discharge: 2022-05-01 | Disposition: A | Discharge status: Home / Self Care | Payer: Medicare Other | Source: Ambulatory Visit | Attending: Family | Admitting: Family

## 2022-05-01 DIAGNOSIS — Z1231 Encounter for screening mammogram for malignant neoplasm of breast: Secondary | ICD-10-CM | POA: Diagnosis not present

## 2022-05-05 DIAGNOSIS — H2512 Age-related nuclear cataract, left eye: Secondary | ICD-10-CM | POA: Diagnosis not present

## 2022-05-05 HISTORY — PX: OTHER SURGICAL HISTORY: SHX169

## 2022-05-06 DIAGNOSIS — H2511 Age-related nuclear cataract, right eye: Secondary | ICD-10-CM | POA: Diagnosis not present

## 2022-05-14 DIAGNOSIS — H2512 Age-related nuclear cataract, left eye: Secondary | ICD-10-CM | POA: Diagnosis not present

## 2022-05-14 DIAGNOSIS — Z9842 Cataract extraction status, left eye: Secondary | ICD-10-CM | POA: Diagnosis not present

## 2022-05-14 DIAGNOSIS — Z961 Presence of intraocular lens: Secondary | ICD-10-CM | POA: Diagnosis not present

## 2022-05-14 DIAGNOSIS — H52222 Regular astigmatism, left eye: Secondary | ICD-10-CM | POA: Diagnosis not present

## 2022-05-14 DIAGNOSIS — H5202 Hypermetropia, left eye: Secondary | ICD-10-CM | POA: Diagnosis not present

## 2022-05-19 DIAGNOSIS — H2511 Age-related nuclear cataract, right eye: Secondary | ICD-10-CM | POA: Diagnosis not present

## 2022-05-28 DIAGNOSIS — H2511 Age-related nuclear cataract, right eye: Secondary | ICD-10-CM | POA: Diagnosis not present

## 2022-05-28 DIAGNOSIS — Z9841 Cataract extraction status, right eye: Secondary | ICD-10-CM | POA: Diagnosis not present

## 2022-05-28 DIAGNOSIS — Z9842 Cataract extraction status, left eye: Secondary | ICD-10-CM | POA: Diagnosis not present

## 2022-05-28 DIAGNOSIS — Z961 Presence of intraocular lens: Secondary | ICD-10-CM | POA: Diagnosis not present

## 2022-07-05 ENCOUNTER — Encounter (HOSPITAL_COMMUNITY): Payer: Self-pay

## 2022-07-05 ENCOUNTER — Emergency Department (HOSPITAL_COMMUNITY): Payer: Medicare Other

## 2022-07-05 ENCOUNTER — Emergency Department (HOSPITAL_COMMUNITY)
Admission: EM | Admit: 2022-07-05 | Discharge: 2022-07-05 | Disposition: A | Discharge status: Home / Self Care | Payer: Medicare Other | Attending: Emergency Medicine | Admitting: Emergency Medicine

## 2022-07-05 ENCOUNTER — Other Ambulatory Visit: Payer: Self-pay

## 2022-07-05 DIAGNOSIS — M549 Dorsalgia, unspecified: Secondary | ICD-10-CM | POA: Insufficient documentation

## 2022-07-05 DIAGNOSIS — R109 Unspecified abdominal pain: Secondary | ICD-10-CM | POA: Diagnosis present

## 2022-07-05 DIAGNOSIS — R1031 Right lower quadrant pain: Secondary | ICD-10-CM | POA: Diagnosis not present

## 2022-07-05 DIAGNOSIS — D72829 Elevated white blood cell count, unspecified: Secondary | ICD-10-CM | POA: Insufficient documentation

## 2022-07-05 DIAGNOSIS — K760 Fatty (change of) liver, not elsewhere classified: Secondary | ICD-10-CM | POA: Diagnosis not present

## 2022-07-05 LAB — URINALYSIS, ROUTINE W REFLEX MICROSCOPIC
Bacteria, UA: NONE SEEN
Bilirubin Urine: NEGATIVE
Glucose, UA: NEGATIVE mg/dL
Hgb urine dipstick: NEGATIVE
Ketones, ur: NEGATIVE mg/dL
Leukocytes,Ua: NEGATIVE
Nitrite: NEGATIVE
Protein, ur: 100 mg/dL — AB
Specific Gravity, Urine: 1.008 (ref 1.005–1.030)
pH: 6 (ref 5.0–8.0)

## 2022-07-05 LAB — COMPREHENSIVE METABOLIC PANEL
ALT: 14 U/L (ref 0–44)
AST: 19 U/L (ref 15–41)
Albumin: 4.6 g/dL (ref 3.5–5.0)
Alkaline Phosphatase: 63 U/L (ref 38–126)
Anion gap: 10 (ref 5–15)
BUN: 19 mg/dL (ref 8–23)
CO2: 23 mmol/L (ref 22–32)
Calcium: 9.1 mg/dL (ref 8.9–10.3)
Chloride: 105 mmol/L (ref 98–111)
Creatinine, Ser: 0.99 mg/dL (ref 0.44–1.00)
GFR, Estimated: 59 mL/min — ABNORMAL LOW (ref >60)
Glucose, Bld: 129 mg/dL — ABNORMAL HIGH (ref 70–99)
Potassium: 3.9 mmol/L (ref 3.5–5.1)
Sodium: 138 mmol/L (ref 135–145)
Total Bilirubin: 1.8 mg/dL — ABNORMAL HIGH (ref 0.3–1.2)
Total Protein: 7.5 g/dL (ref 6.5–8.1)

## 2022-07-05 LAB — LIPASE, BLOOD: Lipase: 41 U/L (ref 11–51)

## 2022-07-05 LAB — CBC WITH DIFFERENTIAL/PLATELET
Abs Immature Granulocytes: 0.05 10*3/uL (ref 0.00–0.07)
Basophils Absolute: 0.1 10*3/uL (ref 0.0–0.1)
Basophils Relative: 1 %
Eosinophils Absolute: 0.2 10*3/uL (ref 0.0–0.5)
Eosinophils Relative: 1 %
HCT: 36.4 % (ref 36.0–46.0)
Hemoglobin: 13.1 g/dL (ref 12.0–15.0)
Immature Granulocytes: 0 %
Lymphocytes Relative: 14 %
Lymphs Abs: 1.9 10*3/uL (ref 0.7–4.0)
MCH: 36.8 pg — ABNORMAL HIGH (ref 26.0–34.0)
MCHC: 36 g/dL (ref 30.0–36.0)
MCV: 102.2 fL — ABNORMAL HIGH (ref 80.0–100.0)
Monocytes Absolute: 1.3 10*3/uL — ABNORMAL HIGH (ref 0.1–1.0)
Monocytes Relative: 10 %
Neutro Abs: 9.9 10*3/uL — ABNORMAL HIGH (ref 1.7–7.7)
Neutrophils Relative %: 74 %
Platelets: 254 10*3/uL (ref 150–400)
RBC: 3.56 MIL/uL — ABNORMAL LOW (ref 3.87–5.11)
RDW: 15.9 % — ABNORMAL HIGH (ref 11.5–15.5)
WBC: 13.4 10*3/uL — ABNORMAL HIGH (ref 4.0–10.5)
nRBC: 0 % (ref 0.0–0.2)

## 2022-07-05 MED ORDER — SODIUM CHLORIDE 0.9 % IV BOLUS
1000.0000 mL | Freq: Once | INTRAVENOUS | Status: AC
  Administered 2022-07-05: 1000 mL via INTRAVENOUS

## 2022-07-05 MED ORDER — IOHEXOL 300 MG/ML  SOLN
100.0000 mL | Freq: Once | INTRAMUSCULAR | Status: AC | PRN
  Administered 2022-07-05: 100 mL via INTRAVENOUS

## 2022-07-05 MED ORDER — ONDANSETRON HCL 4 MG/2ML IJ SOLN
4.0000 mg | Freq: Once | INTRAMUSCULAR | Status: AC
  Administered 2022-07-05: 4 mg via INTRAVENOUS
  Filled 2022-07-05: qty 2

## 2022-07-05 NOTE — ED Provider Notes (Signed)
[place] EMERGENCY DEPARTMENT AT [facility] Provider Note   CSN: [id number] Arrival date & time: 07/05/22  1951     History  Chief Complaint  Patient presents with   Abdominal Pain   Back Pain    Miranda Holland is a 77 y.o. female.  76 yo F with a chief complaints of right-sided abdominal discomfort.  This started about 2 PM today or about 6 hours ago.  She noticed that is more of a mild discomfort and is gotten progressively worse.  She still a bit nauseated but had no vomiting.  No urinary symptoms.  Nothing seems to make it better or worse.  She has had a prior tubal ligation.  Denies history of kidney stones.   Abdominal Pain Back Pain Associated symptoms: abdominal pain        Home Medications Prior to Admission medications   Medication Sig Start Date End Date Taking? Authorizing Provider  albuterol (VENTOLIN HFA) 108 (90 Base) MCG/ACT inhaler TAKE 2 PUFFS BY MOUTH EVERY 6 HOURS AS NEEDED FOR WHEEZE OR SHORTNESS OF BREATH 03/31/22   Mort Sawyers, FNP  ALPRAZolam (XANAX) 0.25 MG tablet Take 1 tablet (0.25 mg total) by mouth as needed for sleep or anxiety. 02/12/22   Mort Sawyers, FNP  Calcium-Magnesium-Vitamin D 600-40-500 MG-MG-UNIT TB24 Take by mouth. Take one by mouth twice a day    [provider]  cholecalciferol (VITAMIN D3) 25 MCG (1000 UNIT) tablet Take 1,000 Units by mouth daily.    [provider]  famotidine (PEPCID) 20 MG tablet 1 tablet at bedtime as needed 05/20/19   [provider]  pravastatin (PRAVACHOL) 20 MG tablet Take 1 tablet (20 mg total) by mouth daily. 02/12/22   Mort Sawyers, FNP  valACYclovir (VALTREX) 1000 MG tablet Take 1 tablet (1,000 mg total) by mouth 2 (two) times daily. 06/22/12   Romine, Edwena Felty, MD      Allergies    Meperidine hcl and Oxycodone hcl    Review of Systems   Review of Systems  Gastrointestinal:  Positive for abdominal pain.  Musculoskeletal:  Positive for back pain.     Physical Exam Updated Vital Signs BP (!) 146/78   Pulse 85   Temp 98.2 F (36.8 C) (Oral)   Resp 16   Ht 5\' 2"  (1.575 m)   Wt 54.4 kg   SpO2 100%   BMI 21.95 kg/m  Physical Exam Vitals and nursing note reviewed.  Constitutional:      General: She is not in acute distress.    Appearance: She is well-developed. She is not diaphoretic.  HENT:     Head: Normocephalic and atraumatic.  Eyes:     Pupils: Pupils are equal, round, and reactive to light.  Cardiovascular:     Rate and Rhythm: Normal rate and regular rhythm.     Heart sounds: No murmur heard.    No friction rub. No gallop.  Pulmonary:     Effort: Pulmonary effort is normal.     Breath sounds: No wheezing or rales.  Abdominal:     General: There is no distension.     Palpations: Abdomen is soft.     Tenderness: There is abdominal tenderness.     Comments: pain to the lower aspect of the abdomen diffusely worse on the right than the left.  Musculoskeletal:        General: No tenderness.     Cervical back: Normal range of motion and neck supple.  Skin:  General: Skin is warm and dry.  Neurological:     Mental Status: She is alert and oriented to person, place, and time.  Psychiatric:        Behavior: Behavior normal.     ED Results / Procedures / Treatments   Labs (all labs ordered are listed, but only abnormal results are displayed) Labs Reviewed  COMPREHENSIVE METABOLIC PANEL - Abnormal; Notable for the following components:      Result Value   Glucose, Bld 129 (*)    Total Bilirubin 1.8 (*)    GFR, Estimated 59 (*)    All other components within normal limits  CBC WITH DIFFERENTIAL/PLATELET - Abnormal; Notable for the following components:   WBC 13.4 (*)    RBC 3.56 (*)    MCV 102.2 (*)    MCH 36.8 (*)    RDW 15.9 (*)    Neutro Abs 9.9 (*)    Monocytes Absolute 1.3 (*)    All other components within normal limits  URINALYSIS, ROUTINE W REFLEX MICROSCOPIC - Abnormal; Notable for the  following components:   Protein, ur 100 (*)    All other components within normal limits  LIPASE, BLOOD    EKG None  Radiology CT ABDOMEN PELVIS W CONTRAST  Result Date: 07/05/2022 CLINICAL DATA:  Right lower quadrant abdominal pain. Chills and nausea. EXAM: CT ABDOMEN AND PELVIS WITH CONTRAST TECHNIQUE: Multidetector CT imaging of the abdomen and pelvis was performed using the standard protocol following bolus administration of intravenous contrast. RADIATION DOSE REDUCTION: This exam was performed according to the departmental dose-optimization program which includes automated exposure control, adjustment of the mA and/or kV according to patient size and/or use of iterative reconstruction technique. CONTRAST:  OMNIPAQUE IOHEXOL 300 MG/ML  SOLN COMPARISON:  Ultrasound right upper quadrant 08/01/2021 FINDINGS: Lower chest: Mild dependent atelectasis in the lung bases. Calcified granuloma on the left. Small esophageal hiatal hernia. Hepatobiliary: Mild diffuse fatty infiltration of the liver. Calcification in the left lobe, likely dystrophic. Subcentimeter low-attenuation lesion in the left lobe. Limited characterization due to size but likely cyst or hemangioma. Gallbladder and bile ducts are normal. Pancreas: Unremarkable. No pancreatic ductal dilatation or surrounding inflammatory changes. Spleen: Normal in size without focal abnormality. Adrenals/Urinary Tract: Adrenal glands are unremarkable. Kidneys are normal, without renal calculi, focal lesion, or hydronephrosis. Bladder is unremarkable. Stomach/Bowel: Stomach, small bowel, and colon are not abnormally distended. Scattered stool throughout the colon. No wall thickening or inflammatory changes are appreciated. Appendix is normal. Vascular/Lymphatic: No significant vascular findings are present. No enlarged abdominal or pelvic lymph nodes. 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 Reproductive: Uterus and bilateral adnexa  are unremarkable. Other: No abdominal wall hernia or abnormality. No abdominopelvic ascites. Musculoskeletal: No acute or significant osseous findings. IMPRESSION: 1. Mild diffuse fatty infiltration of the liver. 2. Left gonadal vein varices. 3. No evidence of bowel obstruction or inflammation. Appendix is normal. Electronically Signed   By: Burman Nieves M.D.   On: 07/05/2022 21:42    Procedures Procedures    Medications Ordered in ED Medications  ondansetron [facility]) injection 4 mg (4 mg Intravenous Given 07/05/22 2047)  sodium chloride 0.9 % bolus 1,000 mL (0 mLs Intravenous Stopped 07/05/22 2227)  iohexol (OMNIPAQUE) 300 MG/ML solution 100 mL (100 mLs Intravenous Contrast Given 07/05/22 2118)    ED Course/ Medical Decision Making/ A&P  Medical Decision Making Amount and/or Complexity of Data Reviewed Labs: ordered. Radiology: ordered.  Risk Prescription drug management.   77 yo F with a chief complaints of right-sided abdominal discomfort.  This has been going on for about 6 hours now.  Mild leukocytosis.  UA negative for infection on my independent interpretation.  Pain to the lower aspect of the abdomen worse in the right lower quadrant will obtain CT imaging.  Offered to treat pain but she declined at this time.  Will give a bolus of IV fluids antiemetics reassess.  LFTs and lipase are unremarkable.  Mild leukocytosis.  UA negative for infection on my independent interpretation.  CT scan of the abdomen pelvis with out obvious intra-abdominal pathology.  My independent interpretation prep she has mild increase stool burden in the right lower quadrant.  I discussed the results with her.  She was concerned that maybe she was constipated.  She plans to try some Senokot at home.  Will have her follow-up with her family doctor.  10:38 PM:  I have discussed the diagnosis/risks/treatment options with the patient and family.  Evaluation and diagnostic testing in  the emergency department does not suggest an emergent condition requiring admission or immediate intervention beyond what has been performed at this time.  They will follow up with PCP. We also discussed returning to the ED immediately if new or worsening sx occur. We discussed the sx which are most concerning (e.g., sudden worsening pain, fever, inability to tolerate by mouth) that necessitate immediate return. Medications administered to the patient during their visit and any new prescriptions provided to the patient are listed below.  Medications given during this visit Medications  ondansetron (ZOFRAN) injection 4 mg (4 mg Intravenous Given 07/05/22 2047)  sodium chloride 0.9 % bolus 1,000 mL (0 mLs Intravenous Stopped 07/05/22 2227)  iohexol (OMNIPAQUE) 300 MG/ML solution 100 mL (100 mLs Intravenous Contrast Given 07/05/22 2118)     The patient appears reasonably screen and/or stabilized for discharge and I doubt any other medical condition or other [facility] requiring further screening, evaluation, or treatment in the ED at this time prior to discharge.          Final Clinical Impression(s) / ED Diagnoses Final diagnoses:  Right lower quadrant abdominal pain    Rx / DC Orders ED Discharge Orders     None         Melene Plan, DO 07/05/22 2238

## 2022-07-05 NOTE — ED Triage Notes (Signed)
Pt reports with lower abdominal pain and back pain since today. Pt also reports having chills and nausea.

## 2022-07-05 NOTE — Discharge Instructions (Signed)
Please return for worsening pain fever or inability eat or drink. 

## 2022-07-09 ENCOUNTER — Telehealth: Payer: Self-pay

## 2022-07-09 NOTE — Telephone Encounter (Signed)
Transition Care Management Follow-up Telephone Call Date of discharge and from where: 07/05/2022 [facility] How have you been since you were released from the hospital? Patient is feeling much better Any questions or concerns? No  Items Reviewed: Did the pt receive and understand the discharge instructions provided? Yes  Medications obtained and verified? Yes  Other? No  Any new allergies since your discharge? No  Dietary orders reviewed? Yes Do you have support at home? Yes   Follow up appointments reviewed:  PCP Hospital f/u appt confirmed?  Patient has an upcoming appointment  Scheduled to see Mort Sawyers, FNP on 08/27/2022 @  [place] f/u appt confirmed? No  Scheduled to see  on  @ . Are transportation arrangements needed? No  If their condition worsens, is the pt aware to call PCP or go to the Emergency Dept.? Yes Was the patient provided with contact information for the PCP's office or ED? Yes Was to pt encouraged to call back with questions or concerns? Yes  Justyn Langham [facility]  [facility] Population Health Community Resource Care Guide   [email or web address]  ?? [phone]   Website: [email or web address]  [email or web address]

## 2022-07-18 ENCOUNTER — Telehealth: Payer: Self-pay | Admitting: *Deleted

## 2022-07-18 NOTE — Telephone Encounter (Signed)
Received vm message from pt regarding what labs she may need at her next appt.TCT patient and spoke with her. She states she went to the ED on 07/05/22 and she had labs done that day. She wasn't sure if she needed more labs done on 07/30/22. Advised that she will still need labs including iron studies. Advised that her WBC was elevated on 07/05/22 though no source of infection identified. U/A wa smegative for UTI.  Pt voiced understanding and is fine with whatever labs Dr. Leonides Schanz needs on 07/30/22

## 2022-07-30 ENCOUNTER — Other Ambulatory Visit: Payer: Self-pay | Admitting: Hematology and Oncology

## 2022-07-30 ENCOUNTER — Inpatient Hospital Stay: Payer: Medicare Other | Attending: Hematology and Oncology

## 2022-07-30 ENCOUNTER — Other Ambulatory Visit: Payer: Self-pay

## 2022-07-30 DIAGNOSIS — R7989 Other specified abnormal findings of blood chemistry: Secondary | ICD-10-CM

## 2022-07-30 LAB — CMP (CANCER CENTER ONLY)
ALT: 10 U/L (ref 0–44)
AST: 14 U/L — ABNORMAL LOW (ref 15–41)
Albumin: 4.2 g/dL (ref 3.5–5.0)
Alkaline Phosphatase: 61 U/L (ref 38–126)
Anion gap: 5 (ref 5–15)
BUN: 13 mg/dL (ref 8–23)
CO2: 27 mmol/L (ref 22–32)
Calcium: 9.1 mg/dL (ref 8.9–10.3)
Chloride: 108 mmol/L (ref 98–111)
Creatinine: 0.65 mg/dL (ref 0.44–1.00)
GFR, Estimated: >60 mL/min (ref >60)
Glucose, Bld: 101 mg/dL — ABNORMAL HIGH (ref 70–99)
Potassium: 4.2 mmol/L (ref 3.5–5.1)
Sodium: 140 mmol/L (ref 135–145)
Total Bilirubin: 1.7 mg/dL — ABNORMAL HIGH (ref 0.3–1.2)
Total Protein: 6.9 g/dL (ref 6.5–8.1)

## 2022-07-30 LAB — IRON AND IRON BINDING CAPACITY (CC-WL,HP ONLY)
Iron: 213 ug/dL — ABNORMAL HIGH (ref 28–170)
Saturation Ratios: 72 % — ABNORMAL HIGH (ref 10.4–31.8)
TIBC: 295 ug/dL (ref 250–450)
UIBC: 82 ug/dL — ABNORMAL LOW (ref 148–442)

## 2022-07-30 LAB — RETIC PANEL
Immature Retic Fract: 12.8 % (ref 2.3–15.9)
RBC.: 3.31 MIL/uL — ABNORMAL LOW (ref 3.87–5.11)
Retic Count, Absolute: 76.5 10*3/uL (ref 19.0–186.0)
Retic Ct Pct: 2.3 % (ref 0.4–3.1)
Reticulocyte Hemoglobin: 38.2 pg (ref >27.9)

## 2022-07-30 LAB — CBC WITH DIFFERENTIAL (CANCER CENTER ONLY)
Abs Immature Granulocytes: 0.01 10*3/uL (ref 0.00–0.07)
Basophils Absolute: 0.1 10*3/uL (ref 0.0–0.1)
Basophils Relative: 2 %
Eosinophils Absolute: 0.2 10*3/uL (ref 0.0–0.5)
Eosinophils Relative: 5 %
HCT: 34 % — ABNORMAL LOW (ref 36.0–46.0)
Hemoglobin: 12.3 g/dL (ref 12.0–15.0)
Immature Granulocytes: 0 %
Lymphocytes Relative: 35 %
Lymphs Abs: 1.7 10*3/uL (ref 0.7–4.0)
MCH: 36.9 pg — ABNORMAL HIGH (ref 26.0–34.0)
MCHC: 36.2 g/dL — ABNORMAL HIGH (ref 30.0–36.0)
MCV: 102.1 fL — ABNORMAL HIGH (ref 80.0–100.0)
Monocytes Absolute: 0.4 10*3/uL (ref 0.1–1.0)
Monocytes Relative: 8 %
Neutro Abs: 2.4 10*3/uL (ref 1.7–7.7)
Neutrophils Relative %: 50 %
Platelet Count: 256 10*3/uL (ref 150–400)
RBC: 3.33 MIL/uL — ABNORMAL LOW (ref 3.87–5.11)
RDW: 16 % — ABNORMAL HIGH (ref 11.5–15.5)
WBC Count: 4.9 10*3/uL (ref 4.0–10.5)
nRBC: 0 % (ref 0.0–0.2)

## 2022-07-30 LAB — FERRITIN: Ferritin: 665 ng/mL — ABNORMAL HIGH (ref 11–307)

## 2022-08-05 DIAGNOSIS — M1712 Unilateral primary osteoarthritis, left knee: Secondary | ICD-10-CM | POA: Diagnosis not present

## 2022-08-27 ENCOUNTER — Encounter: Payer: Self-pay | Admitting: Family

## 2022-08-27 ENCOUNTER — Ambulatory Visit (INDEPENDENT_AMBULATORY_CARE_PROVIDER_SITE_OTHER): Payer: Medicare Other | Admitting: Family

## 2022-08-27 VITALS — BP 112/72 | HR 60 | Temp 97.5°F | Ht 62.0 in | Wt 121.0 lb

## 2022-08-27 DIAGNOSIS — R1013 Epigastric pain: Secondary | ICD-10-CM

## 2022-08-27 DIAGNOSIS — D649 Anemia, unspecified: Secondary | ICD-10-CM

## 2022-08-27 DIAGNOSIS — K449 Diaphragmatic hernia without obstruction or gangrene: Secondary | ICD-10-CM | POA: Insufficient documentation

## 2022-08-27 DIAGNOSIS — R7989 Other specified abnormal findings of blood chemistry: Secondary | ICD-10-CM | POA: Diagnosis not present

## 2022-08-27 DIAGNOSIS — E782 Mixed hyperlipidemia: Secondary | ICD-10-CM | POA: Diagnosis not present

## 2022-08-27 DIAGNOSIS — Z79899 Other long term (current) drug therapy: Secondary | ICD-10-CM | POA: Diagnosis not present

## 2022-08-27 DIAGNOSIS — N9489 Other specified conditions associated with female genital organs and menstrual cycle: Secondary | ICD-10-CM | POA: Diagnosis not present

## 2022-08-27 DIAGNOSIS — R12 Heartburn: Secondary | ICD-10-CM

## 2022-08-27 DIAGNOSIS — T781XXA Other adverse food reactions, not elsewhere classified, initial encounter: Secondary | ICD-10-CM

## 2022-08-27 DIAGNOSIS — M816 Localized osteoporosis [Lequesne]: Secondary | ICD-10-CM | POA: Diagnosis not present

## 2022-08-27 DIAGNOSIS — K760 Fatty (change of) liver, not elsewhere classified: Secondary | ICD-10-CM | POA: Diagnosis not present

## 2022-08-27 LAB — HEPATIC FUNCTION PANEL
ALT: 10 U/L (ref 0–35)
AST: 15 U/L (ref 0–37)
Albumin: 4.4 g/dL (ref 3.5–5.2)
Alkaline Phosphatase: 59 U/L (ref 39–117)
Bilirubin, Direct: 0.3 mg/dL (ref 0.0–0.3)
Total Bilirubin: 1.6 mg/dL — ABNORMAL HIGH (ref 0.2–1.2)
Total Protein: 6.6 g/dL (ref 6.0–8.3)

## 2022-08-27 LAB — LIPID PANEL
Cholesterol: 186 mg/dL (ref 0–200)
HDL: 90.9 mg/dL (ref >39.00)
LDL Cholesterol: 80 mg/dL (ref 0–99)
NonHDL: 95.59
Total CHOL/HDL Ratio: 2
Triglycerides: 79 mg/dL (ref 0.0–149.0)
VLDL: 15.8 mg/dL (ref 0.0–40.0)

## 2022-08-27 LAB — C-REACTIVE PROTEIN: CRP: <1 mg/dL (ref 0.5–20.0)

## 2022-08-27 NOTE — Assessment & Plan Note (Signed)
Pepcid prn.

## 2022-08-27 NOTE — Assessment & Plan Note (Signed)
Cont f/u with hematologist.

## 2022-08-27 NOTE — Assessment & Plan Note (Signed)
Cont f/u with hematologist. Ferritin appears stable.  Pt already self scheduled appt with GI.

## 2022-08-27 NOTE — Assessment & Plan Note (Signed)
Work on diet and exercise.  F/u with GI as elevated ferritin to investigate further.

## 2022-08-27 NOTE — Progress Notes (Signed)
Established Patient Office Visit  Subjective:      CC:  Chief Complaint  Patient presents with   Hyperlipidemia    HPI: Miranda Holland is a 77 y.o. female presenting on 08/27/2022 for Hyperlipidemia . IDA Lab Results  Component Value Date   WBC 4.9 07/30/2022   HGB 12.3 07/30/2022   HCT 34.0 (L) 07/30/2022   MCV 102.1 (H) 07/30/2022   PLT 256 07/30/2022   Elevated ferritin: seeing hematology for this.   HLD: started on pravastatin back in 1/24 due for repeat lab work.   Lab Results  Component Value Date   CHOL 254 (H) 02/11/2022   HDL 95.70 02/11/2022   LDLCALC 144 (H) 02/11/2022   TRIG 70.0 02/11/2022   CHOLHDL 3 02/11/2022   Last vitamin D Lab Results  Component Value Date   VD25OH 38.83 02/11/2022   At night time when lying on her right side she gets a pinching sensation in her epigastric area. She feels as though at times something is 'stuck' in that area. When she turns to another side the discomfort goes away. Suspected heartburn, has started short trial pantoprazole. No issues with beef or pork. Doesn't burp often.      Social history:  Relevant past medical, surgical, family and social history reviewed and updated as indicated. Interim medical history since our last visit reviewed.  Allergies and medications reviewed and updated.  DATA REVIEWED: CHART IN EPIC     ROS: Negative unless specifically indicated above in HPI.    Current Outpatient Medications:    ALPRAZolam (XANAX) 0.25 MG tablet, Take 1 tablet (0.25 mg total) by mouth as needed for sleep or anxiety., Disp: 30 tablet, Rfl: 2   cholecalciferol (VITAMIN D3) 25 MCG (1000 UNIT) tablet, Take 1,000 Units by mouth daily., Disp: , Rfl:    famotidine (PEPCID) 20 MG tablet, 1 tablet at bedtime as needed, Disp: , Rfl:    pravastatin (PRAVACHOL) 20 MG tablet, Take 1 tablet (20 mg total) by mouth daily., Disp: 90 tablet, Rfl: 1   valACYclovir (VALTREX) 1000 MG tablet, Take 1 tablet (1,000 mg  total) by mouth 2 (two) times daily., Disp: 30 tablet, Rfl: 2   albuterol (VENTOLIN HFA) 108 (90 Base) MCG/ACT inhaler, TAKE 2 PUFFS BY MOUTH EVERY 6 HOURS AS NEEDED FOR WHEEZE OR SHORTNESS OF BREATH, Disp: 8.5 each, Rfl: 0   Calcium-Magnesium-Vitamin D 600-40-500 MG-MG-UNIT TB24, Take by mouth. Take one by mouth twice a day, Disp: , Rfl:       Objective:    BP 112/72   Pulse 60   Temp (!) 97.5 F (36.4 C) (Temporal)   Ht 5\' 2"  (1.575 m)   Wt 121 lb (54.9 kg)   SpO2 98%   BMI 22.13 kg/m   Wt Readings from Last 3 Encounters:  08/27/22 121 lb (54.9 kg)  07/05/22 120 lb (54.4 kg)  03/03/22 122 lb 8 oz (55.6 kg)    Physical Exam Constitutional:      General: She is not in acute distress.    Appearance: Normal appearance. She is normal weight. She is not ill-appearing, toxic-appearing or diaphoretic.  HENT:     Head: Normocephalic.  Cardiovascular:     Rate and Rhythm: Normal rate and regular rhythm.  Pulmonary:     Effort: Pulmonary effort is normal.  Abdominal:     General: Bowel sounds are normal.     Tenderness: There is no abdominal tenderness.  Musculoskeletal:  General: Normal range of motion.  Neurological:     General: No focal deficit present.     Mental Status: She is alert and oriented to person, place, and time. Mental status is at baseline.  Psychiatric:        Mood and Affect: Mood normal.        Behavior: Behavior normal.        Thought Content: Thought content normal.        Judgment: Judgment normal.           Assessment & Plan:  Fatty liver Assessment & Plan: Work on diet and exercise.  F/u with GI as elevated ferritin to investigate further.    Pelvic congestion syndrome  Localized osteoporosis without current pathological fracture Assessment & Plan: Declines fosamax. Continue with vitamin D and calcium and weight bearing exercises as tolerated.    Anemia, unspecified type Assessment & Plan: Cont f/u with hematologist.     Elevated ferritin Assessment & Plan: Cont f/u with hematologist. Ferritin appears stable.  Pt already self scheduled appt with GI.    Heartburn Assessment & Plan: Pepcid prn    On statin therapy -     Hepatic function panel  Hiatal hernia  Epigastric pain -     C-reactive protein  Mixed hyperlipidemia Assessment & Plan: Ordered lipid panel, pending results. Work on low cholesterol diet and exercise as tolerated Continue pravastatin   Orders: -     Lipid panel  Gastrointestinal food sensitivity -     Alpha-Gal Panel     Return in about 6 months (around 02/27/2023) for f/u CPE.  Mort Sawyers, MSN, APRN, FNP-C [facility]

## 2022-08-27 NOTE — Assessment & Plan Note (Signed)
Declines fosamax. Continue with vitamin D and calcium and weight bearing exercises as tolerated.

## 2022-08-27 NOTE — Assessment & Plan Note (Signed)
Ordered lipid panel, pending results. Work on low cholesterol diet and exercise as tolerated Continue pravastatin

## 2022-08-28 DIAGNOSIS — H26493 Other secondary cataract, bilateral: Secondary | ICD-10-CM | POA: Diagnosis not present

## 2022-08-28 DIAGNOSIS — H35372 Puckering of macula, left eye: Secondary | ICD-10-CM | POA: Diagnosis not present

## 2022-08-28 DIAGNOSIS — Z961 Presence of intraocular lens: Secondary | ICD-10-CM | POA: Diagnosis not present

## 2022-08-28 DIAGNOSIS — H18413 Arcus senilis, bilateral: Secondary | ICD-10-CM | POA: Diagnosis not present

## 2022-08-28 DIAGNOSIS — H26492 Other secondary cataract, left eye: Secondary | ICD-10-CM | POA: Diagnosis not present

## 2022-08-28 LAB — ALPHA-GAL PANEL
Allergen, Mutton, f88: <0.1 kU/L
Beef: <0.1 kU/L
CLASS: 0
Class: 0

## 2022-08-28 LAB — INTERPRETATION:

## 2022-09-04 DIAGNOSIS — Z961 Presence of intraocular lens: Secondary | ICD-10-CM | POA: Diagnosis not present

## 2022-09-04 DIAGNOSIS — Z9842 Cataract extraction status, left eye: Secondary | ICD-10-CM | POA: Diagnosis not present

## 2022-09-04 DIAGNOSIS — H2512 Age-related nuclear cataract, left eye: Secondary | ICD-10-CM | POA: Diagnosis not present

## 2022-09-16 ENCOUNTER — Telehealth: Payer: Self-pay | Admitting: *Deleted

## 2022-09-16 NOTE — Telephone Encounter (Signed)
Pt states she had labs drawn at the end of June and has not heard the results. PCP suggested she call us to follow up.  Has an appt with GI on 10/09/22.

## 2022-09-25 ENCOUNTER — Other Ambulatory Visit: Payer: Self-pay | Admitting: Family

## 2022-09-25 DIAGNOSIS — E782 Mixed hyperlipidemia: Secondary | ICD-10-CM

## 2022-09-26 NOTE — Telephone Encounter (Signed)
TCT patient regarding lab results and recommendations. Spoke with her. Advised that Dr. Leonides Schanz recommends a liver biopsy to help sort out her increasing ferritin. She states she is not adverse to this. But she staes she prefers ro see her GI doctor first (10/09/22) and see whta that provider recommends. If she needs the liver biopsy, she may decide to go to [facility] for this as her daughter is an NP there and knows the liver folks well there. Advised that we have no problem with those choices but did ask her to let us know how her GI appt goes and her ultimate decision turns out to be. She said she will let us know.

## 2022-09-29 DIAGNOSIS — M1712 Unilateral primary osteoarthritis, left knee: Secondary | ICD-10-CM | POA: Diagnosis not present

## 2022-10-08 DIAGNOSIS — M1712 Unilateral primary osteoarthritis, left knee: Secondary | ICD-10-CM | POA: Diagnosis not present

## 2022-10-09 DIAGNOSIS — R7989 Other specified abnormal findings of blood chemistry: Secondary | ICD-10-CM | POA: Diagnosis not present

## 2022-10-09 DIAGNOSIS — K5900 Constipation, unspecified: Secondary | ICD-10-CM | POA: Diagnosis not present

## 2022-10-09 DIAGNOSIS — R17 Unspecified jaundice: Secondary | ICD-10-CM | POA: Diagnosis not present

## 2022-10-28 DIAGNOSIS — K5900 Constipation, unspecified: Secondary | ICD-10-CM | POA: Diagnosis not present

## 2022-10-28 DIAGNOSIS — R7989 Other specified abnormal findings of blood chemistry: Secondary | ICD-10-CM | POA: Diagnosis not present

## 2022-10-28 DIAGNOSIS — R17 Unspecified jaundice: Secondary | ICD-10-CM | POA: Diagnosis not present

## 2022-10-30 ENCOUNTER — Other Ambulatory Visit (HOSPITAL_COMMUNITY): Payer: Self-pay | Admitting: Gastroenterology

## 2022-10-30 DIAGNOSIS — R7989 Other specified abnormal findings of blood chemistry: Secondary | ICD-10-CM

## 2022-11-14 DIAGNOSIS — Z23 Encounter for immunization: Secondary | ICD-10-CM | POA: Diagnosis not present

## 2022-11-17 ENCOUNTER — Other Ambulatory Visit: Payer: Self-pay | Admitting: Student

## 2022-11-17 DIAGNOSIS — K760 Fatty (change of) liver, not elsewhere classified: Secondary | ICD-10-CM

## 2022-11-18 ENCOUNTER — Encounter (HOSPITAL_COMMUNITY): Payer: Self-pay

## 2022-11-18 ENCOUNTER — Ambulatory Visit (HOSPITAL_COMMUNITY)
Admission: RE | Admit: 2022-11-18 | Discharge: 2022-11-18 | Disposition: A | Discharge status: Home / Self Care | Payer: Medicare Other | Source: Ambulatory Visit | Attending: Gastroenterology

## 2022-11-18 ENCOUNTER — Other Ambulatory Visit: Payer: Self-pay

## 2022-11-18 DIAGNOSIS — R7989 Other specified abnormal findings of blood chemistry: Secondary | ICD-10-CM | POA: Diagnosis not present

## 2022-11-18 DIAGNOSIS — Z8379 Family history of other diseases of the digestive system: Secondary | ICD-10-CM | POA: Insufficient documentation

## 2022-11-18 DIAGNOSIS — K7689 Other specified diseases of liver: Secondary | ICD-10-CM | POA: Insufficient documentation

## 2022-11-18 DIAGNOSIS — K760 Fatty (change of) liver, not elsewhere classified: Secondary | ICD-10-CM

## 2022-11-18 LAB — CBC
HCT: 37.7 % (ref 36.0–46.0)
Hemoglobin: 13.7 g/dL (ref 12.0–15.0)
MCH: 36.9 pg — ABNORMAL HIGH (ref 26.0–34.0)
MCHC: 36.3 g/dL — ABNORMAL HIGH (ref 30.0–36.0)
MCV: 101.6 fL — ABNORMAL HIGH (ref 80.0–100.0)
Platelets: 293 10*3/uL (ref 150–400)
RBC: 3.71 MIL/uL — ABNORMAL LOW (ref 3.87–5.11)
RDW: 16.1 % — ABNORMAL HIGH (ref 11.5–15.5)
WBC: 7.5 10*3/uL (ref 4.0–10.5)
nRBC: 0 % (ref 0.0–0.2)

## 2022-11-18 LAB — PROTIME-INR
INR: 1 (ref 0.8–1.2)
Prothrombin Time: 13.1 s (ref 11.4–15.2)

## 2022-11-18 MED ORDER — GELATIN ABSORBABLE 12-7 MM EX MISC
1.0000 | Freq: Once | CUTANEOUS | Status: AC
  Administered 2022-11-18: 1 via TOPICAL

## 2022-11-18 MED ORDER — FENTANYL CITRATE (PF) 100 MCG/2ML IJ SOLN
INTRAMUSCULAR | Status: AC
  Filled 2022-11-18: qty 2

## 2022-11-18 MED ORDER — MIDAZOLAM HCL 2 MG/2ML IJ SOLN
INTRAMUSCULAR | Status: AC | PRN
Start: 2022-11-18 — End: 2022-11-18
  Administered 2022-11-18 (×2): .5 mg via INTRAVENOUS
  Administered 2022-11-18: 1 mg via INTRAVENOUS

## 2022-11-18 MED ORDER — FENTANYL CITRATE (PF) 100 MCG/2ML IJ SOLN
INTRAMUSCULAR | Status: AC | PRN
Start: 2022-11-18 — End: 2022-11-18
  Administered 2022-11-18 (×2): 25 ug via INTRAVENOUS
  Administered 2022-11-18: 50 ug via INTRAVENOUS

## 2022-11-18 MED ORDER — MIDAZOLAM HCL 2 MG/2ML IJ SOLN
INTRAMUSCULAR | Status: AC
  Filled 2022-11-18: qty 2

## 2022-11-18 MED ORDER — SODIUM CHLORIDE 0.9 % IV SOLN: INTRAVENOUS | Status: DC

## 2022-11-18 MED ORDER — LIDOCAINE HCL 1 % IJ SOLN
10.0000 mL | Freq: Once | INTRAMUSCULAR | Status: AC
  Administered 2022-11-18: 10 mL via INTRADERMAL

## 2022-11-18 NOTE — H&P (Signed)
Chief Complaint: Patient was seen in consultation today for liver core biopsy at the request of Brahmbhatt,Parag  Referring Physician(s): Brahmbhatt,Parag  Supervising Physician: Marliss Coots  Patient Status: [facility] - Out-pt  History of Present Illness: Miranda Holland is a 77 y.o. female   FULL Code status per pt PCP noted unusual findings in labs over 1 yr ago Further tests with persistent Ferritin levels high Referred to Dr Levora Angel--- Now scheduled for liver core biopsy  Past Medical History:  Diagnosis Date   Arthritis    OSTEO   Hypertension    STD (sexually transmitted disease)    HSV 1    Past Surgical History:  Procedure Laterality Date    submucous myoma, resection  1997   BLADDER REPAIR  2007   with mesh, from child bearing   BREAST EXCISIONAL BIOPSY Left 1987   benign   BREAST SURGERY  1987   Lt Breast Bx Benign   Caratact Bilateral 05/2022   THYROIDECTOMY  1991   hemi(left)   TOE FUSION  01/2012   LEFT Big Toe   TUBAL LIGATION      Allergies: Meperidine hcl and Oxycodone hcl  Medications: Prior to Admission medications   Medication Sig Start Date End Date Taking? Authorizing Provider  ALPRAZolam (XANAX) 0.25 MG tablet Take 1 tablet (0.25 mg total) by mouth as needed for sleep or anxiety. 02/12/22  Yes Dugal, Wyatt Mage, FNP  cholecalciferol (VITAMIN D3) 25 MCG (1000 UNIT) tablet Take 1,000 Units by mouth daily.   Yes [provider]  famotidine (PEPCID) 20 MG tablet 1 tablet at bedtime as needed 05/20/19  Yes [provider]  Misc Natural Products (TURMERIC CURCUMIN) CAPS Take 2 capsules by mouth daily. "1800 mg a day"   Yes [provider]  albuterol (VENTOLIN HFA) 108 (90 Base) MCG/ACT inhaler TAKE 2 PUFFS BY MOUTH EVERY 6 HOURS AS NEEDED FOR WHEEZE OR SHORTNESS OF BREATH 03/31/22   Mort Sawyers, FNP  Calcium-Magnesium-Vitamin D 600-40-500 MG-MG-UNIT TB24 Take by mouth. Take one by mouth twice a day    [provider]  pravastatin (PRAVACHOL) 20 MG tablet TAKE 1 TABLET BY MOUTH EVERY DAY 09/26/22   Mort Sawyers, FNP  valACYclovir (VALTREX) 1000 MG tablet Take 1 tablet (1,000 mg total) by mouth 2 (two) times daily. 06/22/12   Romine, Edwena Felty, MD     Family History  Problem Relation Age of Onset   Diabetes Mother    Heart disease Mother    Cirrhosis Father    Alcoholism Father    Hypertension Sister    Heart disease Brother    Breast cancer Paternal Aunt     Social History   Socioeconomic History   Marital status: Married    Spouse name: Not on file   Number of children: 2   Years of education: Not on file   Highest education level: Not on file  Occupational History   Occupation: retired Engineer, civil (consulting)  Tobacco Use   Smoking status: Never   Smokeless tobacco: Never  Vaping Use   Vaping status: Never Used  Substance and Sexual Activity   Alcohol use: Not Currently    Alcohol/week: 21.0 standard drinks of alcohol    Types: 14 Glasses of wine, 7 Unspecified drink type per week    Comment: however stopped drinking may 1   Drug use: No   Sexual activity: Yes    Partners: Male    Birth control/protection: Post-menopausal  Other Topics Concern   Not  on file  Social History Narrative   Two girls   Social Determinants of Health   Financial Resource Strain: Not on file  Food Insecurity: Not on file  Transportation Needs: Not on file  Physical Activity: Not on file  Stress: Not on file  Social Connections: Not on file    Review of Systems: A 12 point ROS discussed and pertinent positives are indicated in the HPI above.  All other systems are negative.  Review of Systems  Constitutional:  Negative for activity change, fatigue and fever.  Respiratory:  Negative for cough and shortness of breath.   Cardiovascular:  Negative for chest pain.  Gastrointestinal:  Negative for abdominal pain, diarrhea, nausea and vomiting.  Neurological:  Negative for weakness.   Psychiatric/Behavioral:  Negative for behavioral problems and confusion.     Vital Signs: BP (!) 166/97   Pulse 81   Temp 97.6 F (36.4 C) (Oral)   Resp 18   Ht 5\' 2"  (1.575 m)   Wt 119 lb (54 kg)   SpO2 96%   BMI 21.77 kg/m   Advance Care Plan: The advanced care plan/surrogate decision maker was discussed at the time of visit and documented in the medical record.    Physical Exam Vitals reviewed.  HENT:     Mouth/Throat:     Mouth: Mucous membranes are moist.  Cardiovascular:     Rate and Rhythm: Normal rate and regular rhythm.     Heart sounds: Normal heart sounds.  Pulmonary:     Effort: Pulmonary effort is normal.     Breath sounds: Normal breath sounds.  Abdominal:     Tenderness: There is no abdominal tenderness.  Musculoskeletal:        General: Normal range of motion.  Skin:    General: Skin is warm.  Neurological:     Mental Status: She is alert and oriented to person, place, and time.  Psychiatric:        Behavior: Behavior normal.     Imaging: No results found.  Labs:  CBC: Recent Labs    04/10/22 1012 07/05/22 1958 07/30/22 0853 11/18/22 1138  WBC 5.2 13.4* 4.9 7.5  HGB 12.7 13.1 12.3 13.7  HCT 34.1* 36.4 34.0* 37.7  PLT 244 254 256 293    COAGS: Recent Labs    11/18/22 1138  INR 1.0    BMP: Recent Labs    12/24/21 1233 04/10/22 1012 07/05/22 1958 07/30/22 0853  NA 138 140 138 140  K 4.2 4.3 3.9 4.2  CL 104 106 105 108  CO2 28 28 23 27   GLUCOSE 112* 96 129* 101*  BUN 14 15 19 13   CALCIUM 9.7 9.2 9.1 9.1  CREATININE 0.72 0.72 0.99 0.65  GFRNONAA >60 >60 59* >60    LIVER FUNCTION TESTS: Recent Labs    04/10/22 1012 07/05/22 1958 07/30/22 0853 08/27/22 0914  BILITOT 1.5* 1.8* 1.7* 1.6*  AST 14* 19 14* 15  ALT 10 14 10 10   ALKPHOS 58 63 61 59  PROT 7.1 7.5 6.9 6.6  ALBUMIN 4.5 4.6 4.2 4.4    TUMOR MARKERS: No results for input(s): "AFPTM", "CEA", "CA199", "CHROMGRNA" in the last 8760 hours.  Assessment  and Plan:  Scheduled for liver core biopsy  Hx high ferritin levels Risks and benefits of liver core biopsy was discussed with the patient and/or patient's family including, but not limited to bleeding, infection, damage to adjacent structures or low yield requiring additional tests.  All of the  questions were answered and there is agreement to proceed.  Consent signed and in chart.   Thank you for this interesting consult.  I greatly enjoyed meeting Brinlee L Jha and look forward to participating in their care.  A copy of this report was sent to the requesting provider on this date.  Electronically Signed: Robet Leu, PA-C 11/18/2022, 12:18 PM   I spent a total of  30 Minutes   in face to face in clinical consultation, greater than 50% of which was counseling/coordinating care for liver core biopsy

## 2022-11-18 NOTE — Procedures (Signed)
Interventional Radiology Procedure Note  Procedure: Ultrasound guided liver biopsy   Findings: Please refer to procedural dictation for full description.18 ga core from right lobe x2, gelfoam slurry needle track embolization.   Complications: None immediate  Estimated Blood Loss: < 5 mL  Recommendations: Strict 3 hour bedrest. Follow up Pathology results.   Marliss Coots, MD

## 2022-11-19 LAB — SURGICAL PATHOLOGY

## 2022-11-19 NOTE — Progress Notes (Signed)
Pt ambulated without difficulty or bleeding.  Discharge instructions given to pt and husband  who verbalize understanding and deny further questions.  Discharged home with  husband who will drive and stay with pt x 24 hrs.

## 2022-11-20 ENCOUNTER — Telehealth: Payer: Self-pay

## 2022-11-20 NOTE — Telephone Encounter (Signed)
Pt LVM stating that she has seen Dr. Leonides Schanz in the past and was referred to have a liver bx preformed. She states that she had her liver bx preformed with a GI MD in [place] and was being referred back for a phlebotomy. This RN made Dr. Leonides Schanz aware. This RN contacted [facility] requesting liver bx with the office fax and call back number.

## 2022-11-25 ENCOUNTER — Telehealth: Payer: Self-pay | Admitting: Hematology and Oncology

## 2022-12-02 ENCOUNTER — Other Ambulatory Visit: Payer: Self-pay | Admitting: Hematology and Oncology

## 2022-12-02 ENCOUNTER — Inpatient Hospital Stay (HOSPITAL_BASED_OUTPATIENT_CLINIC_OR_DEPARTMENT_OTHER): Payer: Medicare Other | Admitting: Hematology and Oncology

## 2022-12-02 ENCOUNTER — Inpatient Hospital Stay: Payer: Medicare Other | Attending: Hematology and Oncology

## 2022-12-02 VITALS — BP 149/87 | HR 70 | Temp 97.5°F | Resp 14 | Wt 120.6 lb

## 2022-12-02 DIAGNOSIS — R7989 Other specified abnormal findings of blood chemistry: Secondary | ICD-10-CM

## 2022-12-02 LAB — CBC WITH DIFFERENTIAL (CANCER CENTER ONLY)
Abs Immature Granulocytes: 0.06 10*3/uL (ref 0.00–0.07)
Basophils Absolute: 0.1 10*3/uL (ref 0.0–0.1)
Basophils Relative: 2 %
Eosinophils Absolute: 0.2 10*3/uL (ref 0.0–0.5)
Eosinophils Relative: 2 %
HCT: 34.6 % — ABNORMAL LOW (ref 36.0–46.0)
Hemoglobin: 12.9 g/dL (ref 12.0–15.0)
Immature Granulocytes: 1 %
Lymphocytes Relative: 23 %
Lymphs Abs: 2 10*3/uL (ref 0.7–4.0)
MCH: 37.1 pg — ABNORMAL HIGH (ref 26.0–34.0)
MCHC: 37.3 g/dL — ABNORMAL HIGH (ref 30.0–36.0)
MCV: 99.4 fL (ref 80.0–100.0)
Monocytes Absolute: 0.5 10*3/uL (ref 0.1–1.0)
Monocytes Relative: 6 %
Neutro Abs: 5.6 10*3/uL (ref 1.7–7.7)
Neutrophils Relative %: 66 %
Platelet Count: 280 10*3/uL (ref 150–400)
RBC: 3.48 MIL/uL — ABNORMAL LOW (ref 3.87–5.11)
RDW: 16.1 % — ABNORMAL HIGH (ref 11.5–15.5)
WBC Count: 8.4 10*3/uL (ref 4.0–10.5)
nRBC: 0.2 % (ref 0.0–0.2)

## 2022-12-02 LAB — IRON AND IRON BINDING CAPACITY (CC-WL,HP ONLY)
Iron: 115 ug/dL (ref 28–170)
Saturation Ratios: 35 % — ABNORMAL HIGH (ref 10.4–31.8)
TIBC: 328 ug/dL (ref 250–450)
UIBC: 213 ug/dL (ref 148–442)

## 2022-12-02 LAB — CMP (CANCER CENTER ONLY)
ALT: 20 U/L (ref 0–44)
AST: 18 U/L (ref 15–41)
Albumin: 4.6 g/dL (ref 3.5–5.0)
Alkaline Phosphatase: 59 U/L (ref 38–126)
Anion gap: 7 (ref 5–15)
BUN: 17 mg/dL (ref 8–23)
CO2: 26 mmol/L (ref 22–32)
Calcium: 9.8 mg/dL (ref 8.9–10.3)
Chloride: 107 mmol/L (ref 98–111)
Creatinine: 0.75 mg/dL (ref 0.44–1.00)
GFR, Estimated: >60 mL/min (ref >60)
Glucose, Bld: 94 mg/dL (ref 70–99)
Potassium: 4.2 mmol/L (ref 3.5–5.1)
Sodium: 140 mmol/L (ref 135–145)
Total Bilirubin: 1.4 mg/dL — ABNORMAL HIGH (ref 0.3–1.2)
Total Protein: 7.1 g/dL (ref 6.5–8.1)

## 2022-12-02 LAB — RETIC PANEL
Immature Retic Fract: 14.6 % (ref 2.3–15.9)
RBC.: 3.52 MIL/uL — ABNORMAL LOW (ref 3.87–5.11)
Retic Count, Absolute: 92.9 10*3/uL (ref 19.0–186.0)
Retic Ct Pct: 2.6 % (ref 0.4–3.1)
Reticulocyte Hemoglobin: 37.7 pg (ref >27.9)

## 2022-12-02 LAB — FERRITIN: Ferritin: 546 ng/mL — ABNORMAL HIGH (ref 11–307)

## 2022-12-02 NOTE — Progress Notes (Signed)
[facility] Telephone:(336) [phone]   Fax:(336) [phone]  PROGRESS NOTE  Patient Care Team: Mort Sawyers, FNP as PCP - General (Family Medicine)  Hematological/Oncological History # Elevated Ferritin  07/23/2021: WBC 5.3, hgb 13.4, MCV 105.2, Plt 265, Ferritin 494.5, Sat 49.9%, iron 162.  08/01/2021: US abdomen showed no evidence of abnormality in the liver/gallbladder  09/11/2021: establish care with Dr. Leonides Schanz. 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   Interval History:  Miranda Holland 77 y.o. female with medical history significant for elevated ferritin of unclear etiology who presents for a follow up visit. The patient's last visit was on 09/11/2021 at which time she established care. In the interim since the last visit She underwent a liver biopsy which showed hepatic parenchyma with mild hepatocellular siderosis.  On exam today Miranda Holland reports that in the interim since her last visit she has undergone cataract surgery.  She reports that she was in the emergency room in June due to abdominal pain.  She underwent a CT scan which did not show a clear cause.  She was also worked up for UTI which was negative.  She notes that she is concerned about the rising ferritin levels.  She notes that she has been doing her best to try to change her lifestyle and drink less alcohol and eat less red meat.  She reports that she has not stopped those activities entirely.  She reports this situation has been stressful and when she does eat red meat or drink red wine she feels stressed about the situation.  She otherwise denies any recent infectious symptoms such as runny nose, sore throat, cough.  She denies any fevers, chills, sweats.  A full 10 point ROS is otherwise negative.  The bulk of our discussion focused on the results of her liver biopsy and steps moving forward.  She notes that she was willing and able  to proceed with phlebotomies at this time.  MEDICAL HISTORY:  Past Medical History:  Diagnosis Date   Arthritis    OSTEO   Hypertension    STD (sexually transmitted disease)    HSV 1    SURGICAL HISTORY: Past Surgical History:  Procedure Laterality Date    submucous myoma, resection  1997   BLADDER REPAIR  2007   with mesh, from child bearing   BREAST EXCISIONAL BIOPSY Left 1987   benign   BREAST SURGERY  1987   Lt Breast Bx Benign   Caratact Bilateral 05/2022   THYROIDECTOMY  1991   hemi(left)   TOE FUSION  01/2012   LEFT Big Toe   TUBAL LIGATION      SOCIAL HISTORY: Social History   Socioeconomic History   Marital status: Married    Spouse name: Not on file   Number of children: 2   Years of education: Not on file   Highest education level: Not on file  Occupational History   Occupation: retired Engineer, civil (consulting)  Tobacco Use   Smoking status: Never   Smokeless tobacco: Never  Vaping Use   Vaping status: Never Used  Substance and Sexual Activity   Alcohol use: Not Currently    Alcohol/week: 21.0 standard drinks of alcohol    Types: 14 Glasses of wine, 7 Unspecified drink type per week    Comment: however stopped drinking may 1   Drug use: No   Sexual activity: Yes    Partners: Male    Birth control/protection: Post-menopausal  Other  Topics Concern   Not on file  Social History Narrative   Two girls   Social Determinants of Health   Financial Resource Strain: Not on file  Food Insecurity: Not on file  Transportation Needs: Not on file  Physical Activity: Not on file  Stress: Not on file  Social Connections: Not on file  Intimate Partner Violence: Not on file    FAMILY HISTORY: Family History  Problem Relation Age of Onset   Diabetes Mother    Heart disease Mother    Cirrhosis Father    Alcoholism Father    Hypertension Sister    Heart disease Brother    Breast cancer Paternal Aunt     ALLERGIES:  is allergic to meperidine hcl and oxycodone  hcl.  MEDICATIONS:  Current Outpatient Medications  Medication Sig Dispense Refill   albuterol (VENTOLIN HFA) 108 (90 Base) MCG/ACT inhaler TAKE 2 PUFFS BY MOUTH EVERY 6 HOURS AS NEEDED FOR WHEEZE OR SHORTNESS OF BREATH 8.5 each 0   ALPRAZolam (XANAX) 0.25 MG tablet Take 1 tablet (0.25 mg total) by mouth as needed for sleep or anxiety. 30 tablet 2   Calcium-Magnesium-Vitamin D 600-40-500 MG-MG-UNIT TB24 Take by mouth. Take one by mouth twice a day     cholecalciferol (VITAMIN D3) 25 MCG (1000 UNIT) tablet Take 1,000 Units by mouth daily.     famotidine (PEPCID) 20 MG tablet 1 tablet at bedtime as needed     Misc Natural Products (TURMERIC CURCUMIN) CAPS Take 2 capsules by mouth daily. "1800 mg a day"     pravastatin (PRAVACHOL) 20 MG tablet TAKE 1 TABLET BY MOUTH EVERY DAY 30 tablet 5   valACYclovir (VALTREX) 1000 MG tablet Take 1 tablet (1,000 mg total) by mouth 2 (two) times daily. 30 tablet 2   No current facility-administered medications for this visit.    REVIEW OF SYSTEMS:   Constitutional: ( - ) fevers, ( - )  chills , ( - ) night sweats Eyes: ( - ) blurriness of vision, ( - ) double vision, ( - ) watery eyes Ears, nose, mouth, throat, and face: ( - ) mucositis, ( - ) sore throat Respiratory: ( - ) cough, ( - ) dyspnea, ( - ) wheezes Cardiovascular: ( - ) palpitation, ( - ) chest discomfort, ( - ) lower extremity swelling Gastrointestinal:  ( - ) nausea, ( - ) heartburn, ( - ) change in bowel habits Skin: ( - ) abnormal skin rashes Lymphatics: ( - ) new lymphadenopathy, ( - ) easy bruising Neurological: ( - ) numbness, ( - ) tingling, ( - ) new weaknesses Behavioral/Psych: ( - ) mood change, ( - ) new changes  All other systems were reviewed with the patient and are negative.  PHYSICAL EXAMINATION:  Vitals:   12/02/22 1224  BP: (!) 149/87  Pulse: 70  Resp: 14  Temp: (!) 97.5 F (36.4 C)  SpO2: 100%   Filed Weights   12/02/22 1224  Weight: 120 lb 9.6 oz (54.7 kg)     GENERAL: well appearing elderly Caucasian female, alert, no distress and comfortable SKIN: skin color, texture, turgor are normal, no rashes or significant lesions EYES: conjunctiva are pink and non-injected, sclera clear LUNGS: clear to auscultation and percussion with normal breathing effort HEART: regular rate & rhythm and no murmurs and no lower extremity edema Musculoskeletal: no cyanosis of digits and no clubbing  PSYCH: alert & oriented x 3, fluent speech NEURO: no focal motor/sensory deficits  LABORATORY DATA:  I have reviewed the data as listed    Latest Ref Rng & Units 12/02/2022   12:11 PM 11/18/2022   11:38 AM 07/30/2022    8:53 AM  CBC  WBC 4.0 - 10.5 K/uL 8.4  7.5  4.9   Hemoglobin 12.0 - 15.0 g/dL 36.6  44.0  34.7   Hematocrit 36.0 - 46.0 % 34.6  37.7  34.0   Platelets 150 - 400 K/uL 280  293  256        Latest Ref Rng & Units 12/02/2022   12:11 PM 08/27/2022    9:14 AM 07/30/2022    8:53 AM  CMP  Glucose 70 - 99 mg/dL 94   425   BUN 8 - 23 mg/dL 17   13   Creatinine 9.56 - 1.00 mg/dL 3.87   5.64   Sodium 332 - 145 mmol/L 140   140   Potassium 3.5 - 5.1 mmol/L 4.2   4.2   Chloride 98 - 111 mmol/L 107   108   CO2 22 - 32 mmol/L 26   27   Calcium 8.9 - 10.3 mg/dL 9.8   9.1   Total Protein 6.5 - 8.1 g/dL 7.1  6.6  6.9   Total Bilirubin 0.3 - 1.2 mg/dL 1.4  1.6  1.7   Alkaline Phos 38 - 126 U/L 59  59  61   AST 15 - 41 U/L 18  15  14    ALT 0 - 44 U/L 20  10  10      RADIOGRAPHIC STUDIES: US BIOPSY (LIVER)  Result Date: 11/18/2022 INDICATION: 77 year old female with history elevated ferritin levels. EXAM: ULTRASOUND BIOPSY CORE LIVER MEDICATIONS: None. ANESTHESIA/SEDATION: Moderate (conscious) sedation was employed during this procedure. A total of Versed 2 mg and Fentanyl 100 mcg was administered intravenously. Moderate Sedation Time: 12 minutes. The patient's level of consciousness and vital signs were monitored continuously by radiology nursing  throughout the procedure under my direct supervision. COMPLICATIONS: None immediate. PROCEDURE: Informed written consent was obtained from the patient after a thorough discussion of the procedural risks, benefits and alternatives. All questions were addressed. Maximal Sterile Barrier Technique was utilized including caps, mask, sterile gowns, sterile gloves, sterile drape, hand hygiene and skin antiseptic. A timeout was performed prior to the initiation of the procedure. Preprocedure ultrasound demonstrated safe window in the right upper quadrant for nonfocal liver biopsy. The right upper quadrant was prepped and draped in standard fashion. Local anesthesia was administered subdermally at the planned entry site as well as under ultrasound guidance along the hepatic capsule. A skin nick was made. A 17 gauge introducer needle was advanced to the hepatic parenchyma under ultrasound guidance. Next, a total of 2, 18 gauge core biopsies were obtained. The samples were placed in formalin and sent to Pathology. Under ultrasound guidance, a Gel-Foam slurry was administered along the needle entry tract as the introducer needle was withdrawn. Postprocedure ultrasound demonstrated no evidence of perihepatic fluid collection. The patient tolerated the procedure well. IMPRESSION: Technically successful ultrasound-guided nonfocal core liver biopsy from the right lobe of the liver. Marliss Coots, MD [facility] Electronically Signed   By: Marliss Coots M.D.   On: 11/18/2022 15:26    ASSESSMENT & PLAN Miranda Holland 77 y.o. female with medical history significant for elevated ferritin of unclear etiology who presents for a follow up visit.  # Elevated Ferritin -- Genetic testing shows no evidence of hereditary hemochromatosis, however liver biopsy shows  mild iron deposition. -- Given her markedly elevated ferritin levels with no other clear etiology I recommend we  proceed with phlebotomy at this time. -- We will plan to target a ferritin of less than 100 with every 2 week phlebotomies. -- Labs today show white blood cell count 8.4, hemoglobin 12.9, MCV 99.4, and platelets of 280.  Ferritin level pending today. --We discussed with the patient that phlebotomy in the situations is controversial, however I do believe that there is minimal potential for harm in proceeding with phlebotomy. -- Will have the patient return to clinic in 2 months time with interval every 2 weekly phlebotomies.  No orders of the defined types were placed in this encounter.   All questions were answered. The patient knows to call the clinic with any problems, questions or concerns.  A total of more than 30 minutes were spent on this encounter with face-to-face time and non-face-to-face time, including preparing to see the patient, ordering tests and/or medications, counseling the patient and coordination of care as outlined above.   Ulysees Barns, MD Department of Hematology/Oncology [facility] Phone: [phone] Pager: [phone] Email: [email or web address]  12/02/2022 2:58 PM

## 2022-12-04 ENCOUNTER — Other Ambulatory Visit: Payer: Self-pay | Admitting: Hematology and Oncology

## 2022-12-04 DIAGNOSIS — R7989 Other specified abnormal findings of blood chemistry: Secondary | ICD-10-CM

## 2022-12-05 ENCOUNTER — Inpatient Hospital Stay: Payer: Medicare Other

## 2022-12-05 ENCOUNTER — Inpatient Hospital Stay: Payer: Medicare Other | Attending: Internal Medicine

## 2022-12-05 VITALS — BP 140/62 | HR 62 | Resp 14

## 2022-12-05 DIAGNOSIS — R7989 Other specified abnormal findings of blood chemistry: Secondary | ICD-10-CM | POA: Diagnosis not present

## 2022-12-05 LAB — RETIC PANEL
Immature Retic Fract: 10.7 % (ref 2.3–15.9)
RBC.: 3.34 MIL/uL — ABNORMAL LOW (ref 3.87–5.11)
Retic Count, Absolute: 100.9 10*3/uL (ref 19.0–186.0)
Retic Ct Pct: 3 % (ref 0.4–3.1)
Reticulocyte Hemoglobin: 37.3 pg (ref >27.9)

## 2022-12-05 LAB — CBC WITH DIFFERENTIAL (CANCER CENTER ONLY)
Abs Immature Granulocytes: 0.04 10*3/uL (ref 0.00–0.07)
Basophils Absolute: 0.1 10*3/uL (ref 0.0–0.1)
Basophils Relative: 2 %
Eosinophils Absolute: 0.3 10*3/uL (ref 0.0–0.5)
Eosinophils Relative: 5 %
HCT: 33.6 % — ABNORMAL LOW (ref 36.0–46.0)
Hemoglobin: 12.2 g/dL (ref 12.0–15.0)
Immature Granulocytes: 1 %
Lymphocytes Relative: 28 %
Lymphs Abs: 1.5 10*3/uL (ref 0.7–4.0)
MCH: 36.6 pg — ABNORMAL HIGH (ref 26.0–34.0)
MCHC: 36.3 g/dL — ABNORMAL HIGH (ref 30.0–36.0)
MCV: 100.9 fL — ABNORMAL HIGH (ref 80.0–100.0)
Monocytes Absolute: 0.5 10*3/uL (ref 0.1–1.0)
Monocytes Relative: 9 %
Neutro Abs: 2.9 10*3/uL (ref 1.7–7.7)
Neutrophils Relative %: 55 %
Platelet Count: 284 10*3/uL (ref 150–400)
RBC: 3.33 MIL/uL — ABNORMAL LOW (ref 3.87–5.11)
RDW: 16.3 % — ABNORMAL HIGH (ref 11.5–15.5)
WBC Count: 5.2 10*3/uL (ref 4.0–10.5)
nRBC: 0 % (ref 0.0–0.2)

## 2022-12-05 LAB — CMP (CANCER CENTER ONLY)
ALT: 18 U/L (ref 0–44)
AST: 16 U/L (ref 15–41)
Albumin: 4.3 g/dL (ref 3.5–5.0)
Alkaline Phosphatase: 60 U/L (ref 38–126)
Anion gap: 7 (ref 5–15)
BUN: 13 mg/dL (ref 8–23)
CO2: 27 mmol/L (ref 22–32)
Calcium: 9.3 mg/dL (ref 8.9–10.3)
Chloride: 106 mmol/L (ref 98–111)
Creatinine: 0.76 mg/dL (ref 0.44–1.00)
GFR, Estimated: >60 mL/min (ref >60)
Glucose, Bld: 90 mg/dL (ref 70–99)
Potassium: 4.1 mmol/L (ref 3.5–5.1)
Sodium: 140 mmol/L (ref 135–145)
Total Bilirubin: 1.2 mg/dL (ref 0.3–1.2)
Total Protein: 6.8 g/dL (ref 6.5–8.1)

## 2022-12-05 LAB — IRON AND IRON BINDING CAPACITY (CC-WL,HP ONLY)
Iron: 222 ug/dL — ABNORMAL HIGH (ref 28–170)
Saturation Ratios: 73 % — ABNORMAL HIGH (ref 10.4–31.8)
TIBC: 302 ug/dL (ref 250–450)
UIBC: 80 ug/dL — ABNORMAL LOW (ref 148–442)

## 2022-12-05 LAB — FERRITIN: Ferritin: 491 ng/mL — ABNORMAL HIGH (ref 11–307)

## 2022-12-05 NOTE — Progress Notes (Signed)
Miranda Holland presents today for phlebotomy per MD orders. 18G IV kit used in RAC. Phlebotomy procedure started at 0854 and ended at 0908. 518 grams removed. Patient observed for 30 minutes after procedure without any incident. Patient tolerated procedure well. IV needle removed intact.

## 2022-12-05 NOTE — Patient Instructions (Signed)

## 2022-12-19 ENCOUNTER — Inpatient Hospital Stay: Payer: Medicare Other

## 2022-12-19 ENCOUNTER — Other Ambulatory Visit: Payer: Self-pay | Admitting: Hematology and Oncology

## 2022-12-19 VITALS — BP 122/71 | HR 73 | Resp 18

## 2022-12-19 DIAGNOSIS — R7989 Other specified abnormal findings of blood chemistry: Secondary | ICD-10-CM | POA: Diagnosis not present

## 2022-12-19 LAB — CBC WITH DIFFERENTIAL (CANCER CENTER ONLY)
Abs Immature Granulocytes: 0.03 10*3/uL (ref 0.00–0.07)
Basophils Absolute: 0.1 10*3/uL (ref 0.0–0.1)
Basophils Relative: 2 %
Eosinophils Absolute: 0.3 10*3/uL (ref 0.0–0.5)
Eosinophils Relative: 6 %
HCT: 32.2 % — ABNORMAL LOW (ref 36.0–46.0)
Hemoglobin: 11.6 g/dL — ABNORMAL LOW (ref 12.0–15.0)
Immature Granulocytes: 1 %
Lymphocytes Relative: 29 %
Lymphs Abs: 1.6 10*3/uL (ref 0.7–4.0)
MCH: 36.8 pg — ABNORMAL HIGH (ref 26.0–34.0)
MCHC: 36 g/dL (ref 30.0–36.0)
MCV: 102.2 fL — ABNORMAL HIGH (ref 80.0–100.0)
Monocytes Absolute: 0.4 10*3/uL (ref 0.1–1.0)
Monocytes Relative: 8 %
Neutro Abs: 3 10*3/uL (ref 1.7–7.7)
Neutrophils Relative %: 54 %
Platelet Count: 283 10*3/uL (ref 150–400)
RBC: 3.15 MIL/uL — ABNORMAL LOW (ref 3.87–5.11)
RDW: 17 % — ABNORMAL HIGH (ref 11.5–15.5)
WBC Count: 5.4 10*3/uL (ref 4.0–10.5)
nRBC: 0.6 % — ABNORMAL HIGH (ref 0.0–0.2)

## 2022-12-19 LAB — CMP (CANCER CENTER ONLY)
ALT: 15 U/L (ref 0–44)
AST: 15 U/L (ref 15–41)
Albumin: 4.3 g/dL (ref 3.5–5.0)
Alkaline Phosphatase: 63 U/L (ref 38–126)
Anion gap: 5 (ref 5–15)
BUN: 9 mg/dL (ref 8–23)
CO2: 29 mmol/L (ref 22–32)
Calcium: 9.3 mg/dL (ref 8.9–10.3)
Chloride: 106 mmol/L (ref 98–111)
Creatinine: 0.75 mg/dL (ref 0.44–1.00)
GFR, Estimated: >60 mL/min (ref >60)
Glucose, Bld: 95 mg/dL (ref 70–99)
Potassium: 4.1 mmol/L (ref 3.5–5.1)
Sodium: 140 mmol/L (ref 135–145)
Total Bilirubin: 1 mg/dL (ref <1.2)
Total Protein: 6.6 g/dL (ref 6.5–8.1)

## 2022-12-19 LAB — FERRITIN: Ferritin: 375 ng/mL — ABNORMAL HIGH (ref 11–307)

## 2022-12-19 LAB — IRON AND IRON BINDING CAPACITY (CC-WL,HP ONLY)
Iron: 114 ug/dL (ref 28–170)
Saturation Ratios: 36 % — ABNORMAL HIGH (ref 10.4–31.8)
TIBC: 316 ug/dL (ref 250–450)
UIBC: 202 ug/dL (ref 148–442)

## 2022-12-19 LAB — RETIC PANEL
Immature Retic Fract: 11 % (ref 2.3–15.9)
RBC.: 3.16 MIL/uL — ABNORMAL LOW (ref 3.87–5.11)
Retic Count, Absolute: 95.1 10*3/uL (ref 19.0–186.0)
Retic Ct Pct: 3 % (ref 0.4–3.1)
Reticulocyte Hemoglobin: 36.2 pg (ref >27.9)

## 2022-12-19 NOTE — Patient Instructions (Signed)

## 2022-12-19 NOTE — Progress Notes (Signed)
Miranda Holland presents today for phlebotomy per MD orders. Phlebotomy procedure started at 0919 and ended at 73. 18G used in left AC; 511 grams removed. Patient observed for 30 minutes after procedure without any incident. Patient tolerated procedure well, VS stable IV needle removed intact.

## 2023-01-07 ENCOUNTER — Other Ambulatory Visit: Payer: Self-pay | Admitting: *Deleted

## 2023-01-07 DIAGNOSIS — R7989 Other specified abnormal findings of blood chemistry: Secondary | ICD-10-CM

## 2023-01-09 ENCOUNTER — Inpatient Hospital Stay: Payer: Medicare Other

## 2023-01-09 ENCOUNTER — Encounter: Payer: Self-pay | Admitting: Hematology and Oncology

## 2023-01-09 ENCOUNTER — Inpatient Hospital Stay: Payer: Medicare Other | Attending: Internal Medicine

## 2023-01-09 VITALS — BP 146/67 | HR 71 | Temp 97.7°F | Resp 18

## 2023-01-09 DIAGNOSIS — R7989 Other specified abnormal findings of blood chemistry: Secondary | ICD-10-CM | POA: Diagnosis not present

## 2023-01-09 DIAGNOSIS — Z79899 Other long term (current) drug therapy: Secondary | ICD-10-CM | POA: Diagnosis not present

## 2023-01-09 DIAGNOSIS — Z79624 Long term (current) use of inhibitors of nucleotide synthesis: Secondary | ICD-10-CM | POA: Insufficient documentation

## 2023-01-09 LAB — FERRITIN: Ferritin: 283 ng/mL (ref 11–307)

## 2023-01-09 LAB — CBC WITH DIFFERENTIAL (CANCER CENTER ONLY)
Abs Immature Granulocytes: 0.06 10*3/uL (ref 0.00–0.07)
Basophils Absolute: 0.1 10*3/uL (ref 0.0–0.1)
Basophils Relative: 1 %
Eosinophils Absolute: 0.2 10*3/uL (ref 0.0–0.5)
Eosinophils Relative: 4 %
HCT: 35.7 % — ABNORMAL LOW (ref 36.0–46.0)
Hemoglobin: 12.8 g/dL (ref 12.0–15.0)
Immature Granulocytes: 1 %
Lymphocytes Relative: 31 %
Lymphs Abs: 1.7 10*3/uL (ref 0.7–4.0)
MCH: 37.6 pg — ABNORMAL HIGH (ref 26.0–34.0)
MCHC: 35.9 g/dL (ref 30.0–36.0)
MCV: 105 fL — ABNORMAL HIGH (ref 80.0–100.0)
Monocytes Absolute: 0.5 10*3/uL (ref 0.1–1.0)
Monocytes Relative: 9 %
Neutro Abs: 2.9 10*3/uL (ref 1.7–7.7)
Neutrophils Relative %: 54 %
Platelet Count: 290 10*3/uL (ref 150–400)
RBC: 3.4 MIL/uL — ABNORMAL LOW (ref 3.87–5.11)
RDW: 17.4 % — ABNORMAL HIGH (ref 11.5–15.5)
WBC Count: 5.5 10*3/uL (ref 4.0–10.5)
nRBC: 0.4 % — ABNORMAL HIGH (ref 0.0–0.2)

## 2023-01-09 LAB — IRON AND IRON BINDING CAPACITY (CC-WL,HP ONLY)
Iron: 161 ug/dL (ref 28–170)
Saturation Ratios: 44 % — ABNORMAL HIGH (ref 10.4–31.8)
TIBC: 364 ug/dL (ref 250–450)
UIBC: 203 ug/dL (ref 148–442)

## 2023-01-09 LAB — CMP (CANCER CENTER ONLY)
ALT: 14 U/L (ref 0–44)
AST: 17 U/L (ref 15–41)
Albumin: 4.6 g/dL (ref 3.5–5.0)
Alkaline Phosphatase: 60 U/L (ref 38–126)
Anion gap: 8 (ref 5–15)
BUN: 15 mg/dL (ref 8–23)
CO2: 28 mmol/L (ref 22–32)
Calcium: 9.6 mg/dL (ref 8.9–10.3)
Chloride: 104 mmol/L (ref 98–111)
Creatinine: 0.77 mg/dL (ref 0.44–1.00)
GFR, Estimated: >60 mL/min (ref >60)
Glucose, Bld: 85 mg/dL (ref 70–99)
Potassium: 4 mmol/L (ref 3.5–5.1)
Sodium: 140 mmol/L (ref 135–145)
Total Bilirubin: 1.2 mg/dL — ABNORMAL HIGH (ref <1.2)
Total Protein: 7 g/dL (ref 6.5–8.1)

## 2023-01-09 LAB — RETIC PANEL
Immature Retic Fract: 20.1 % — ABNORMAL HIGH (ref 2.3–15.9)
RBC.: 3.4 MIL/uL — ABNORMAL LOW (ref 3.87–5.11)
Retic Count, Absolute: 95.5 10*3/uL (ref 19.0–186.0)
Retic Ct Pct: 2.8 % (ref 0.4–3.1)
Reticulocyte Hemoglobin: 38.8 pg (ref >27.9)

## 2023-01-09 NOTE — Patient Instructions (Signed)

## 2023-01-09 NOTE — Progress Notes (Signed)
Miranda Holland presents today for phlebotomy per MD orders. Hgb 12.8 and previous Ferritin 375.  Phlebotomy procedure started at 0908 and ended at 0914. 518 grams removed via 16G RAC Patient observed for 30 minutes after procedure without any incident. Patient tolerated procedure well. IV needle removed intact.

## 2023-01-23 ENCOUNTER — Inpatient Hospital Stay (HOSPITAL_BASED_OUTPATIENT_CLINIC_OR_DEPARTMENT_OTHER): Payer: Medicare Other | Admitting: Physician Assistant

## 2023-01-23 ENCOUNTER — Inpatient Hospital Stay: Payer: Medicare Other

## 2023-01-23 ENCOUNTER — Other Ambulatory Visit: Payer: Self-pay | Admitting: Hematology and Oncology

## 2023-01-23 ENCOUNTER — Inpatient Hospital Stay (HOSPITAL_BASED_OUTPATIENT_CLINIC_OR_DEPARTMENT_OTHER): Payer: Medicare Other | Admitting: Hematology and Oncology

## 2023-01-23 ENCOUNTER — Other Ambulatory Visit: Payer: Self-pay

## 2023-01-23 VITALS — BP 136/73 | HR 83 | Temp 97.6°F | Resp 16 | Ht 62.0 in | Wt 119.8 lb

## 2023-01-23 VITALS — BP 124/64 | HR 66 | Temp 97.5°F | Resp 16

## 2023-01-23 VITALS — BP 110/76 | HR 82 | Temp 98.3°F | Resp 16

## 2023-01-23 DIAGNOSIS — Z79899 Other long term (current) drug therapy: Secondary | ICD-10-CM | POA: Diagnosis not present

## 2023-01-23 DIAGNOSIS — R7989 Other specified abnormal findings of blood chemistry: Secondary | ICD-10-CM

## 2023-01-23 DIAGNOSIS — R110 Nausea: Secondary | ICD-10-CM

## 2023-01-23 DIAGNOSIS — Z79624 Long term (current) use of inhibitors of nucleotide synthesis: Secondary | ICD-10-CM | POA: Diagnosis not present

## 2023-01-23 LAB — CBC WITH DIFFERENTIAL (CANCER CENTER ONLY)
Abs Immature Granulocytes: 0.03 10*3/uL (ref 0.00–0.07)
Basophils Absolute: 0.1 10*3/uL (ref 0.0–0.1)
Basophils Relative: 2 %
Eosinophils Absolute: 0.4 10*3/uL (ref 0.0–0.5)
Eosinophils Relative: 6 %
HCT: 34 % — ABNORMAL LOW (ref 36.0–46.0)
Hemoglobin: 12.5 g/dL (ref 12.0–15.0)
Immature Granulocytes: 0 %
Lymphocytes Relative: 19 %
Lymphs Abs: 1.3 10*3/uL (ref 0.7–4.0)
MCH: 37.4 pg — ABNORMAL HIGH (ref 26.0–34.0)
MCHC: 36.8 g/dL — ABNORMAL HIGH (ref 30.0–36.0)
MCV: 101.8 fL — ABNORMAL HIGH (ref 80.0–100.0)
Monocytes Absolute: 0.7 10*3/uL (ref 0.1–1.0)
Monocytes Relative: 11 %
Neutro Abs: 4.4 10*3/uL (ref 1.7–7.7)
Neutrophils Relative %: 62 %
Platelet Count: 283 10*3/uL (ref 150–400)
RBC: 3.34 MIL/uL — ABNORMAL LOW (ref 3.87–5.11)
RDW: 17.3 % — ABNORMAL HIGH (ref 11.5–15.5)
WBC Count: 6.9 10*3/uL (ref 4.0–10.5)
nRBC: 0 % (ref 0.0–0.2)

## 2023-01-23 LAB — CMP (CANCER CENTER ONLY)
ALT: 16 U/L (ref 0–44)
AST: 17 U/L (ref 15–41)
Albumin: 4.6 g/dL (ref 3.5–5.0)
Alkaline Phosphatase: 67 U/L (ref 38–126)
Anion gap: 7 (ref 5–15)
BUN: 11 mg/dL (ref 8–23)
CO2: 27 mmol/L (ref 22–32)
Calcium: 9.5 mg/dL (ref 8.9–10.3)
Chloride: 104 mmol/L (ref 98–111)
Creatinine: 0.86 mg/dL (ref 0.44–1.00)
GFR, Estimated: >60 mL/min (ref >60)
Glucose, Bld: 102 mg/dL — ABNORMAL HIGH (ref 70–99)
Potassium: 3.9 mmol/L (ref 3.5–5.1)
Sodium: 138 mmol/L (ref 135–145)
Total Bilirubin: 1.2 mg/dL — ABNORMAL HIGH (ref <1.2)
Total Protein: 6.7 g/dL (ref 6.5–8.1)

## 2023-01-23 LAB — IRON AND IRON BINDING CAPACITY (CC-WL,HP ONLY)
Iron: 130 ug/dL (ref 28–170)
Saturation Ratios: 35 % — ABNORMAL HIGH (ref 10.4–31.8)
TIBC: 367 ug/dL (ref 250–450)
UIBC: 237 ug/dL (ref 148–442)

## 2023-01-23 LAB — RETIC PANEL
Immature Retic Fract: 17.8 % — ABNORMAL HIGH (ref 2.3–15.9)
RBC.: 3.25 MIL/uL — ABNORMAL LOW (ref 3.87–5.11)
Retic Count, Absolute: 69.2 10*3/uL (ref 19.0–186.0)
Retic Ct Pct: 2.1 % (ref 0.4–3.1)
Reticulocyte Hemoglobin: 36.3 pg (ref >27.9)

## 2023-01-23 LAB — FERRITIN: Ferritin: 270 ng/mL (ref 11–307)

## 2023-01-23 MED ORDER — ONDANSETRON HCL 8 MG PO TABS
4.0000 mg | ORAL_TABLET | Freq: Once | ORAL | Status: AC
  Administered 2023-01-23: 4 mg via ORAL
  Filled 2023-01-23: qty 1

## 2023-01-23 MED ORDER — SODIUM CHLORIDE 0.9 % IV SOLN: Freq: Once | INTRAVENOUS | Status: AC

## 2023-01-23 NOTE — Patient Instructions (Signed)

## 2023-01-23 NOTE — Addendum Note (Signed)
Addended by: Mertha Finders A on: 01/23/2023 10:17 AM   Modules accepted: Orders

## 2023-01-23 NOTE — Progress Notes (Signed)
[facility] [phone]   Fax:([phone]  PROGRESS NOTE  Patient Care Team: Mort Sawyers, FNP as PCP - General (Family Medicine)  Hematological/Oncological History # Elevated Ferritin  07/23/2021: WBC 5.3, hgb 13.4, MCV 105.2, Plt 265, Ferritin 494.5, Sat 49.9%, iron 162.  08/01/2021: US abdomen showed no evidence of abnormality in the liver/gallbladder  09/11/2021: establish care with Dr. Leonides Schanz. 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  12/05/2022: start therapeutic phlebotomy q 2 weeks.   Interval History:  Miranda Holland 77 y.o. female with medical history significant for elevated ferritin of unclear etiology who presents for a follow up visit. The patient's last visit was on 12/02/2022. In the interim since the last visit she has undergone every 2 week phlebotomy.  On exam today Miranda Holland reports she has been having a sore throat that is COVID-negative.  She reports it is mostly in her throat with no runny nose or cough.  She reports he thinks he is on the tail end of it.  She reports that she has been tolerating her phlebotomies well with no lightheadedness or dizziness.  She reports that about a week afterwards she can get a little shortness of breath on exertion.  She reports her energy is about a 9 out of 10 today.  She drinks plenty of fluid before her phlebotomies.  She has had no other major changes in her health.  She has been doing her best to try to avoid red meat and green leafy vegetables.  She is also increasing her calcium intake to block absorption.  She is eating mostly chicken and [place].  Otherwise she has no questions concerns or complaints.  She is willing and able to proceed with phlebotomies at this time.  She denies any fevers, chills, sweats.  A full 10 point ROS is otherwise negative.  MEDICAL HISTORY:  Past Medical History:  Diagnosis Date   Arthritis     OSTEO   Hypertension    STD (sexually transmitted disease)    HSV 1    SURGICAL HISTORY: Past Surgical History:  Procedure Laterality Date    submucous myoma, resection  1997   BLADDER REPAIR  2007   with mesh, from child bearing   BREAST EXCISIONAL BIOPSY Left 1987   benign   BREAST SURGERY  1987   Lt Breast Bx Benign   Caratact Bilateral 05/2022   THYROIDECTOMY  1991   hemi(left)   TOE FUSION  01/2012   LEFT Big Toe   TUBAL LIGATION      SOCIAL HISTORY: Social History   Socioeconomic History   Marital status: Married    Spouse name: Not on file   Number of children: 2   Years of education: Not on file   Highest education level: Not on file  Occupational History   Occupation: retired Engineer, civil (consulting)  Tobacco Use   Smoking status: Never   Smokeless tobacco: Never  Vaping Use   Vaping status: Never Used  Substance and Sexual Activity   Alcohol use: Not Currently    Alcohol/week: 21.0 standard drinks of alcohol    Types: 14 Glasses of wine, 7 Unspecified drink type per week    Comment: however stopped drinking may 1   Drug use: No   Sexual activity: Yes    Partners: Male    Birth control/protection: Post-menopausal  Other Topics Concern   Not on file  Social History Narrative   Two girls  Social Drivers of Corporate investment banker Strain: Not on file  Food Insecurity: Not on file  Transportation Needs: Not on file  Physical Activity: Not on file  Stress: Not on file  Social Connections: Not on file  Intimate Partner Violence: Not on file    FAMILY HISTORY: Family History  Problem Relation Age of Onset   Diabetes Mother    Heart disease Mother    Cirrhosis Father    Alcoholism Father    Hypertension Sister    Heart disease Brother    Breast cancer Paternal Aunt     ALLERGIES:  is allergic to meperidine hcl and oxycodone hcl.  MEDICATIONS:  Current Outpatient Medications  Medication Sig Dispense Refill   albuterol (VENTOLIN HFA) 108 (90 Base)  MCG/ACT inhaler TAKE 2 PUFFS BY MOUTH EVERY 6 HOURS AS NEEDED FOR WHEEZE OR SHORTNESS OF BREATH 8.5 each 0   ALPRAZolam (XANAX) 0.25 MG tablet Take 1 tablet (0.25 mg total) by mouth as needed for sleep or anxiety. 30 tablet 2   Calcium-Magnesium-Vitamin D 600-40-500 MG-MG-UNIT TB24 Take by mouth. Take one by mouth twice a day     cholecalciferol (VITAMIN D3) 25 MCG (1000 UNIT) tablet Take 1,000 Units by mouth daily.     famotidine (PEPCID) 20 MG tablet 1 tablet at bedtime as needed     Misc Natural Products (TURMERIC CURCUMIN) CAPS Take 2 capsules by mouth daily. "1800 mg a day"     pravastatin (PRAVACHOL) 20 MG tablet TAKE 1 TABLET BY MOUTH EVERY DAY 30 tablet 5   valACYclovir (VALTREX) 1000 MG tablet Take 1 tablet (1,000 mg total) by mouth 2 (two) times daily. 30 tablet 2   No current facility-administered medications for this visit.    REVIEW OF SYSTEMS:   Constitutional: ( - ) fevers, ( - )  chills , ( - ) night sweats Eyes: ( - ) blurriness of vision, ( - ) double vision, ( - ) watery eyes Ears, nose, mouth, throat, and face: ( - ) mucositis, ( - ) sore throat Respiratory: ( - ) cough, ( - ) dyspnea, ( - ) wheezes Cardiovascular: ( - ) palpitation, ( - ) chest discomfort, ( - ) lower extremity swelling Gastrointestinal:  ( - ) nausea, ( - ) heartburn, ( - ) change in bowel habits Skin: ( - ) abnormal skin rashes Lymphatics: ( - ) new lymphadenopathy, ( - ) easy bruising Neurological: ( - ) numbness, ( - ) tingling, ( - ) new weaknesses Behavioral/Psych: ( - ) mood change, ( - ) new changes  All other systems were reviewed with the patient and are negative.  PHYSICAL EXAMINATION:  Vitals:   01/23/23 0816  BP: 136/73  Pulse: 83  Resp: 16  Temp: 97.6 F (36.4 C)  SpO2: 100%    Filed Weights   01/23/23 0816  Weight: 119 lb 12.8 oz (54.3 kg)     GENERAL: well appearing elderly Caucasian female, alert, no distress and comfortable SKIN: skin color, texture, turgor are  normal, no rashes or significant lesions EYES: conjunctiva are pink and non-injected, sclera clear LUNGS: clear to auscultation and percussion with normal breathing effort HEART: regular rate & rhythm and no murmurs and no lower extremity edema Musculoskeletal: no cyanosis of digits and no clubbing  PSYCH: alert & oriented x 3, fluent speech NEURO: no focal motor/sensory deficits  LABORATORY DATA:  I have reviewed the data as listed    Latest Ref Rng & Units 01/23/2023  8:02 AM 01/09/2023    8:28 AM 12/19/2022    8:47 AM  CBC  WBC 4.0 - 10.5 K/uL 6.9  5.5  5.4   Hemoglobin 12.0 - 15.0 g/dL 16.1  09.6  04.5   Hematocrit 36.0 - 46.0 % 34.0  35.7  32.2   Platelets 150 - 400 K/uL 283  290  283        Latest Ref Rng & Units 01/23/2023    8:02 AM 01/09/2023    8:28 AM 12/19/2022    8:47 AM  CMP  Glucose 70 - 99 mg/dL 409  85  95   BUN 8 - 23 mg/dL 11  15  9    Creatinine 0.44 - 1.00 mg/dL 8.11  9.14  7.82   Sodium 135 - 145 mmol/L 138  140  140   Potassium 3.5 - 5.1 mmol/L 3.9  4.0  4.1   Chloride 98 - 111 mmol/L 104  104  106   CO2 22 - 32 mmol/L 27  28  29    Calcium 8.9 - 10.3 mg/dL 9.5  9.6  9.3   Total Protein 6.5 - 8.1 g/dL 6.7  7.0  6.6   Total Bilirubin <1.2 mg/dL 1.2  1.2  1.0   Alkaline Phos 38 - 126 U/L 67  60  63   AST 15 - 41 U/L 17  17  15    ALT 0 - 44 U/L 16  14  15      RADIOGRAPHIC STUDIES: No results found.  ASSESSMENT & PLAN Miranda Holland 77 y.o. female with medical history significant for elevated ferritin of unclear etiology who presents for a follow up visit.  # Elevated Ferritin -- Genetic testing shows no evidence of hereditary hemochromatosis, however liver biopsy shows mild iron deposition. -- Given her markedly elevated ferritin levels with no other clear etiology I recommend we proceed with phlebotomy at this time. -- We will plan to target a ferritin of less than 100 with every 2 week phlebotomies. -- Labs today show white blood cell count  6.9, Hgb 12.5, MCV 101.8, Plt 283.  Ferritin level pending today, last 283 on 01/09/2023. --We discussed with the patient that phlebotomy in this situation is controversial, however I do believe that there is minimal potential for harm in proceeding with phlebotomy. -- Will have the patient return to clinic in 3 months time with interval every 2 weekly phlebotomies.  No orders of the defined types were placed in this encounter.   All questions were answered. The patient knows to call the clinic with any problems, questions or concerns.  A total of more than 30 minutes were spent on this encounter with face-to-face time and non-face-to-face time, including preparing to see the patient, ordering tests and/or medications, counseling the patient and coordination of care as outlined above.   Ulysees Barns, MD Department of Hematology/Oncology [facility] Phone: [phone] Pager: [phone] Email: [email or web address]  01/23/2023 9:07 AM

## 2023-01-23 NOTE — Progress Notes (Signed)
Called to the lobby to evaluate patient.  Miranda Holland is a 77 yo female with pertinent history of elevated ferritin unclear etiology. She was seen by Dr. Leonides Schanz earlier today and had her 4th phlebotomy. Phlebotomy was uncomplicated. Afterwards while in the lobby she reported to an RN she was feeling lightheaded. Patient sat in a chair and symptoms worsened. She had brief LOC while sitting in the chair.  On my arrival patient is pale appearing, diaphoretic. She is alert and oriented. Patient taken to exam room and IVF started with 1L NS. VSS. EKG showing normal sinus rhythm. Patient admits to feeling nauseous, PO zofran administered. Patient reassessed after IVF and has returned to baseline. She is tolerating PO intake and ambulated in clinic without difficulty. She feels comfortable going home. Strict ED precautions discussed.      Vitals:   01/23/23 1014  BP: 124/64  Pulse: 66  Resp: 16  Temp: (!) 97.5 F (36.4 C)  SpO2: 100%

## 2023-01-23 NOTE — Progress Notes (Signed)
Miranda Holland presents today for phlebotomy per MD orders. Phlebotomy procedure started at 0917 and ended at 0921. 532 grams removed using a 16g phlebotomy kit. Patient observed for 15 minutes after procedure without any incident. Patient tolerated procedure well. IV needle removed intact.

## 2023-02-12 ENCOUNTER — Other Ambulatory Visit: Payer: Self-pay | Admitting: Nurse Practitioner

## 2023-02-12 DIAGNOSIS — R7989 Other specified abnormal findings of blood chemistry: Secondary | ICD-10-CM

## 2023-02-13 ENCOUNTER — Inpatient Hospital Stay: Payer: Medicare Other

## 2023-02-13 ENCOUNTER — Inpatient Hospital Stay: Payer: Medicare Other | Attending: Internal Medicine

## 2023-02-13 VITALS — BP 131/71 | HR 63 | Resp 17

## 2023-02-13 DIAGNOSIS — R7989 Other specified abnormal findings of blood chemistry: Secondary | ICD-10-CM

## 2023-02-13 LAB — RETIC PANEL
Immature Retic Fract: 12.7 % (ref 2.3–15.9)
RBC.: 3.38 MIL/uL — ABNORMAL LOW (ref 3.87–5.11)
Retic Count, Absolute: 83.8 10*3/uL (ref 19.0–186.0)
Retic Ct Pct: 2.5 % (ref 0.4–3.1)
Reticulocyte Hemoglobin: 35.5 pg (ref >27.9)

## 2023-02-13 LAB — CMP (CANCER CENTER ONLY)
ALT: 10 U/L (ref 0–44)
AST: 14 U/L — ABNORMAL LOW (ref 15–41)
Albumin: 4.4 g/dL (ref 3.5–5.0)
Alkaline Phosphatase: 60 U/L (ref 38–126)
Anion gap: 7 (ref 5–15)
BUN: 16 mg/dL (ref 8–23)
CO2: 26 mmol/L (ref 22–32)
Calcium: 9.2 mg/dL (ref 8.9–10.3)
Chloride: 106 mmol/L (ref 98–111)
Creatinine: 0.79 mg/dL (ref 0.44–1.00)
GFR, Estimated: >60 mL/min (ref >60)
Glucose, Bld: 81 mg/dL (ref 70–99)
Potassium: 4.1 mmol/L (ref 3.5–5.1)
Sodium: 139 mmol/L (ref 135–145)
Total Bilirubin: 1 mg/dL (ref 0.0–1.2)
Total Protein: 6.6 g/dL (ref 6.5–8.1)

## 2023-02-13 LAB — IRON AND IRON BINDING CAPACITY (CC-WL,HP ONLY)
Iron: 122 ug/dL (ref 28–170)
Saturation Ratios: 34 % — ABNORMAL HIGH (ref 10.4–31.8)
TIBC: 357 ug/dL (ref 250–450)
UIBC: 235 ug/dL (ref 148–442)

## 2023-02-13 LAB — CBC WITH DIFFERENTIAL (CANCER CENTER ONLY)
Abs Immature Granulocytes: 0.05 10*3/uL (ref 0.00–0.07)
Basophils Absolute: 0.1 10*3/uL (ref 0.0–0.1)
Basophils Relative: 2 %
Eosinophils Absolute: 0.4 10*3/uL (ref 0.0–0.5)
Eosinophils Relative: 7 %
HCT: 32.9 % — ABNORMAL LOW (ref 36.0–46.0)
Hemoglobin: 12 g/dL (ref 12.0–15.0)
Immature Granulocytes: 1 %
Lymphocytes Relative: 29 %
Lymphs Abs: 1.6 10*3/uL (ref 0.7–4.0)
MCH: 36 pg — ABNORMAL HIGH (ref 26.0–34.0)
MCHC: 36.5 g/dL — ABNORMAL HIGH (ref 30.0–36.0)
MCV: 98.8 fL (ref 80.0–100.0)
Monocytes Absolute: 0.5 10*3/uL (ref 0.1–1.0)
Monocytes Relative: 9 %
Neutro Abs: 2.8 10*3/uL (ref 1.7–7.7)
Neutrophils Relative %: 52 %
Platelet Count: 271 10*3/uL (ref 150–400)
RBC: 3.33 MIL/uL — ABNORMAL LOW (ref 3.87–5.11)
RDW: 17.7 % — ABNORMAL HIGH (ref 11.5–15.5)
WBC Count: 5.4 10*3/uL (ref 4.0–10.5)
nRBC: 0 % (ref 0.0–0.2)

## 2023-02-13 LAB — FERRITIN: Ferritin: 160 ng/mL (ref 11–307)

## 2023-02-13 NOTE — Progress Notes (Signed)
 Miranda Holland presents today for phlebotomy per MD orders. Phlebotomy procedure started at 0837 and ended at 0847. 18G phlebotomy kit to R AC, 520 grams removed. Patient observed for 30 minutes after procedure without any incident.Food and drink provided.  Patient tolerated procedure well. IV catheter removed intact.

## 2023-02-13 NOTE — Patient Instructions (Signed)

## 2023-02-26 ENCOUNTER — Other Ambulatory Visit: Payer: Self-pay | Admitting: *Deleted

## 2023-02-26 DIAGNOSIS — R7989 Other specified abnormal findings of blood chemistry: Secondary | ICD-10-CM

## 2023-02-27 ENCOUNTER — Inpatient Hospital Stay: Payer: Medicare Other

## 2023-02-27 VITALS — BP 143/70 | HR 66 | Temp 97.5°F | Resp 17

## 2023-02-27 DIAGNOSIS — R7989 Other specified abnormal findings of blood chemistry: Secondary | ICD-10-CM

## 2023-02-27 LAB — CBC WITH DIFFERENTIAL (CANCER CENTER ONLY)
Abs Immature Granulocytes: 0.06 10*3/uL (ref 0.00–0.07)
Basophils Absolute: 0.1 10*3/uL (ref 0.0–0.1)
Basophils Relative: 2 %
Eosinophils Absolute: 0.2 10*3/uL (ref 0.0–0.5)
Eosinophils Relative: 4 %
HCT: 32.9 % — ABNORMAL LOW (ref 36.0–46.0)
Hemoglobin: 11.8 g/dL — ABNORMAL LOW (ref 12.0–15.0)
Immature Granulocytes: 1 %
Lymphocytes Relative: 25 %
Lymphs Abs: 1.4 10*3/uL (ref 0.7–4.0)
MCH: 34.7 pg — ABNORMAL HIGH (ref 26.0–34.0)
MCHC: 35.9 g/dL (ref 30.0–36.0)
MCV: 96.8 fL (ref 80.0–100.0)
Monocytes Absolute: 0.5 10*3/uL (ref 0.1–1.0)
Monocytes Relative: 9 %
Neutro Abs: 3.5 10*3/uL (ref 1.7–7.7)
Neutrophils Relative %: 59 %
Platelet Count: 295 10*3/uL (ref 150–400)
RBC: 3.4 MIL/uL — ABNORMAL LOW (ref 3.87–5.11)
RDW: 18.3 % — ABNORMAL HIGH (ref 11.5–15.5)
WBC Count: 5.8 10*3/uL (ref 4.0–10.5)
nRBC: 0.3 % — ABNORMAL HIGH (ref 0.0–0.2)

## 2023-02-27 LAB — FERRITIN: Ferritin: 84 ng/mL (ref 11–307)

## 2023-02-27 LAB — RETIC PANEL
Immature Retic Fract: 17.9 % — ABNORMAL HIGH (ref 2.3–15.9)
RBC.: 3.35 MIL/uL — ABNORMAL LOW (ref 3.87–5.11)
Retic Count, Absolute: 77.1 10*3/uL (ref 19.0–186.0)
Retic Ct Pct: 2.3 % (ref 0.4–3.1)
Reticulocyte Hemoglobin: 35.3 pg (ref >27.9)

## 2023-02-27 LAB — IRON AND IRON BINDING CAPACITY (CC-WL,HP ONLY)
Iron: 94 ug/dL (ref 28–170)
Saturation Ratios: 24 % (ref 10.4–31.8)
TIBC: 395 ug/dL (ref 250–450)
UIBC: 301 ug/dL (ref 148–442)

## 2023-02-27 LAB — CMP (CANCER CENTER ONLY)
ALT: 12 U/L (ref 0–44)
AST: 14 U/L — ABNORMAL LOW (ref 15–41)
Albumin: 4.3 g/dL (ref 3.5–5.0)
Alkaline Phosphatase: 57 U/L (ref 38–126)
Anion gap: 8 (ref 5–15)
BUN: 12 mg/dL (ref 8–23)
CO2: 27 mmol/L (ref 22–32)
Calcium: 9.2 mg/dL (ref 8.9–10.3)
Chloride: 102 mmol/L (ref 98–111)
Creatinine: 0.72 mg/dL (ref 0.44–1.00)
GFR, Estimated: >60 mL/min (ref >60)
Glucose, Bld: 101 mg/dL — ABNORMAL HIGH (ref 70–99)
Potassium: 3.9 mmol/L (ref 3.5–5.1)
Sodium: 137 mmol/L (ref 135–145)
Total Bilirubin: 0.8 mg/dL (ref 0.0–1.2)
Total Protein: 6.4 g/dL — ABNORMAL LOW (ref 6.5–8.1)

## 2023-02-27 NOTE — Progress Notes (Signed)
Miranda Holland presents today for phlebotomy per MD orders. Phlebotomy procedure started at 0932 and ended at 0945. 516 grams removed. Patient observed for 30 minutes after procedure without any incident. Patient tolerated procedure well. IV needle removed from right AC intact.

## 2023-03-03 ENCOUNTER — Telehealth: Payer: Self-pay | Admitting: *Deleted

## 2023-03-03 NOTE — Telephone Encounter (Signed)
This patient called today, states her labwork from last week is better & is wondering if she needs to continue therapeutic phlebotomies at this point.  She has 3 upcoming appointments scheduled for phlebotomy & wants to know if they can be canceled.

## 2023-03-04 ENCOUNTER — Telehealth: Payer: Self-pay | Admitting: *Deleted

## 2023-03-04 NOTE — Telephone Encounter (Signed)
TCT patient regarding the need for future phlebotomies. Spoke with her. Advised that we have cancelled her scheduled phlebotomies as her Ferritin is under 100 and just have her coming in for labs and clinic visit on 04/10/23. Asked pt if she would prefer her labs earlier in the week so her results would be available on 04/10/23. Pt would like that. Scheduling message sent for lab appt on 04/08/23 @ 9:30 am. Pt aware.

## 2023-03-13 ENCOUNTER — Inpatient Hospital Stay: Payer: Medicare Other

## 2023-03-13 ENCOUNTER — Telehealth: Payer: Self-pay | Admitting: Hematology and Oncology

## 2023-03-13 NOTE — Telephone Encounter (Signed)
 Rescheduled appointments per provider on PAL. Patient is aware of the changes made and is active on MyChart.

## 2023-03-24 ENCOUNTER — Other Ambulatory Visit: Payer: Self-pay | Admitting: Hematology and Oncology

## 2023-03-24 ENCOUNTER — Inpatient Hospital Stay: Payer: Medicare Other | Attending: Internal Medicine

## 2023-03-24 DIAGNOSIS — R7989 Other specified abnormal findings of blood chemistry: Secondary | ICD-10-CM

## 2023-03-24 LAB — CMP (CANCER CENTER ONLY)
ALT: 10 U/L (ref 0–44)
AST: 13 U/L — ABNORMAL LOW (ref 15–41)
Albumin: 4.5 g/dL (ref 3.5–5.0)
Alkaline Phosphatase: 67 U/L (ref 38–126)
Anion gap: 7 (ref 5–15)
BUN: 10 mg/dL (ref 8–23)
CO2: 28 mmol/L (ref 22–32)
Calcium: 9.2 mg/dL (ref 8.9–10.3)
Chloride: 105 mmol/L (ref 98–111)
Creatinine: 0.71 mg/dL (ref 0.44–1.00)
GFR, Estimated: >60 mL/min (ref >60)
Glucose, Bld: 139 mg/dL — ABNORMAL HIGH (ref 70–99)
Potassium: 3.8 mmol/L (ref 3.5–5.1)
Sodium: 140 mmol/L (ref 135–145)
Total Bilirubin: 1.1 mg/dL (ref 0.0–1.2)
Total Protein: 6.7 g/dL (ref 6.5–8.1)

## 2023-03-24 LAB — CBC WITH DIFFERENTIAL (CANCER CENTER ONLY)
Abs Immature Granulocytes: 0.04 10*3/uL (ref 0.00–0.07)
Basophils Absolute: 0.1 10*3/uL (ref 0.0–0.1)
Basophils Relative: 1 %
Eosinophils Absolute: 0.2 10*3/uL (ref 0.0–0.5)
Eosinophils Relative: 2 %
HCT: 35.4 % — ABNORMAL LOW (ref 36.0–46.0)
Hemoglobin: 12.6 g/dL (ref 12.0–15.0)
Immature Granulocytes: 1 %
Lymphocytes Relative: 24 %
Lymphs Abs: 2 10*3/uL (ref 0.7–4.0)
MCH: 34.9 pg — ABNORMAL HIGH (ref 26.0–34.0)
MCHC: 35.6 g/dL (ref 30.0–36.0)
MCV: 98.1 fL (ref 80.0–100.0)
Monocytes Absolute: 0.5 10*3/uL (ref 0.1–1.0)
Monocytes Relative: 6 %
Neutro Abs: 5.4 10*3/uL (ref 1.7–7.7)
Neutrophils Relative %: 66 %
Platelet Count: 281 10*3/uL (ref 150–400)
RBC: 3.61 MIL/uL — ABNORMAL LOW (ref 3.87–5.11)
RDW: 18.4 % — ABNORMAL HIGH (ref 11.5–15.5)
WBC Count: 8.1 10*3/uL (ref 4.0–10.5)
nRBC: 0 % (ref 0.0–0.2)

## 2023-03-24 LAB — FERRITIN: Ferritin: 57 ng/mL (ref 11–307)

## 2023-03-25 DIAGNOSIS — D225 Melanocytic nevi of trunk: Secondary | ICD-10-CM | POA: Diagnosis not present

## 2023-03-25 DIAGNOSIS — L814 Other melanin hyperpigmentation: Secondary | ICD-10-CM | POA: Diagnosis not present

## 2023-03-25 DIAGNOSIS — L820 Inflamed seborrheic keratosis: Secondary | ICD-10-CM | POA: Diagnosis not present

## 2023-03-25 DIAGNOSIS — L570 Actinic keratosis: Secondary | ICD-10-CM | POA: Diagnosis not present

## 2023-03-25 DIAGNOSIS — L821 Other seborrheic keratosis: Secondary | ICD-10-CM | POA: Diagnosis not present

## 2023-03-25 DIAGNOSIS — D1801 Hemangioma of skin and subcutaneous tissue: Secondary | ICD-10-CM | POA: Diagnosis not present

## 2023-03-27 ENCOUNTER — Inpatient Hospital Stay: Payer: Medicare Other

## 2023-03-27 ENCOUNTER — Other Ambulatory Visit: Payer: Self-pay | Admitting: Hematology and Oncology

## 2023-03-27 ENCOUNTER — Inpatient Hospital Stay: Payer: Medicare Other | Admitting: Hematology and Oncology

## 2023-03-27 VITALS — BP 151/81 | HR 74 | Temp 97.3°F | Resp 14 | Wt 121.1 lb

## 2023-03-27 DIAGNOSIS — R110 Nausea: Secondary | ICD-10-CM | POA: Diagnosis not present

## 2023-03-27 DIAGNOSIS — R7989 Other specified abnormal findings of blood chemistry: Secondary | ICD-10-CM

## 2023-03-27 NOTE — Progress Notes (Signed)
 [facility] [phone]   [phone]) [phone]  PROGRESS NOTE  Patient Care Team: Mort Sawyers, FNP as PCP - General (Family Medicine)  Hematological/Oncological History # Elevated Ferritin  07/23/2021: WBC 5.3, hgb 13.4, MCV 105.2, Plt 265, Ferritin 494.5, Sat 49.9%, iron 162.  08/01/2021: US abdomen showed no evidence of abnormality in the liver/gallbladder  09/11/2021: establish care with Dr. Leonides Schanz. 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  12/05/2022: start therapeutic phlebotomy q 2 weeks.   Interval History:  Miranda Holland 78 y.o. female with medical history significant for elevated ferritin of unclear etiology who presents for a follow up visit. The patient's last visit was on 01/23/2023. In the interim since the last visit she has undergone every 2 week phlebotomy.  On exam today Miranda Holland reports she had a syncopal episode after her last phlebotomy.  She reports that she feels much better and reports that the only incident that she had while undergoing vitamins.  She reports that she had a cold that day but also her phlebotomy only lasted about 5 minutes.  She reports that in the interim since her last visit she has been decreasing her intake of red meat and avoiding alcohol.  She reports maybe a glass of wine once per month.  She notes that she is not having any issues with low energy, lightheadedness, dizziness, shortness of breath.  Overall she feels quite well.. Otherwise she has no questions concerns or complaints.  She is willing and able to proceed with monitoring at this time.  She denies any fevers, chills, sweats.  A full 10 point ROS is otherwise negative.  MEDICAL HISTORY:  Past Medical History:  Diagnosis Date   Arthritis    OSTEO   Hypertension    STD (sexually transmitted disease)    HSV 1    SURGICAL HISTORY: Past Surgical History:  Procedure Laterality  Date    submucous myoma, resection  1997   BLADDER REPAIR  2007   with mesh, from child bearing   BREAST EXCISIONAL BIOPSY Left 1987   benign   BREAST SURGERY  1987   Lt Breast Bx Benign   Caratact Bilateral 05/2022   THYROIDECTOMY  1991   hemi(left)   TOE FUSION  01/2012   LEFT Big Toe   TUBAL LIGATION      SOCIAL HISTORY: Social History   Socioeconomic History   Marital status: Married    Spouse name: Not on file   Number of children: 2   Years of education: Not on file   Highest education level: Not on file  Occupational History   Occupation: retired Engineer, civil (consulting)  Tobacco Use   Smoking status: Never   Smokeless tobacco: Never  Vaping Use   Vaping status: Never Used  Substance and Sexual Activity   Alcohol use: Not Currently    Alcohol/week: 21.0 standard drinks of alcohol    Types: 14 Glasses of wine, 7 Unspecified drink type per week    Comment: however stopped drinking may 1   Drug use: No   Sexual activity: Yes    Partners: Male    Birth control/protection: Post-menopausal  Other Topics Concern   Not on file  Social History Narrative   Two girls   Social Drivers of Corporate investment banker Strain: Not on file  Food Insecurity: Not on file  Transportation Needs: Not on file  Physical Activity: Not on file  Stress: Not on file  Social Connections: Not on file  Intimate Partner Violence: Not on file    FAMILY HISTORY: Family History  Problem Relation Age of Onset   Diabetes Mother    Heart disease Mother    Cirrhosis Father    Alcoholism Father    Hypertension Sister    Heart disease Brother    Breast cancer Paternal Aunt     ALLERGIES:  is allergic to meperidine hcl and oxycodone hcl.  MEDICATIONS:  Current Outpatient Medications  Medication Sig Dispense Refill   albuterol (VENTOLIN HFA) 108 (90 Base) MCG/ACT inhaler TAKE 2 PUFFS BY MOUTH EVERY 6 HOURS AS NEEDED FOR WHEEZE OR SHORTNESS OF BREATH 8.5 each 0   ALPRAZolam (XANAX) 0.25 MG tablet  Take 1 tablet (0.25 mg total) by mouth as needed for sleep or anxiety. 30 tablet 2   Calcium-Magnesium-Vitamin D 600-40-500 MG-MG-UNIT TB24 Take by mouth. Take one by mouth twice a day     cholecalciferol (VITAMIN D3) 25 MCG (1000 UNIT) tablet Take 1,000 Units by mouth daily.     famotidine (PEPCID) 20 MG tablet 1 tablet at bedtime as needed     Misc Natural Products (TURMERIC CURCUMIN) CAPS Take 2 capsules by mouth daily. "1800 mg a day"     valACYclovir (VALTREX) 1000 MG tablet Take 1 tablet (1,000 mg total) by mouth 2 (two) times daily. 30 tablet 2   No current facility-administered medications for this visit.    REVIEW OF SYSTEMS:   Constitutional: ( - ) fevers, ( - )  chills , ( - ) night sweats Eyes: ( - ) blurriness of vision, ( - ) double vision, ( - ) watery eyes Ears, nose, mouth, throat, and face: ( - ) mucositis, ( - ) sore throat Respiratory: ( - ) cough, ( - ) dyspnea, ( - ) wheezes Cardiovascular: ( - ) palpitation, ( - ) chest discomfort, ( - ) lower extremity swelling Gastrointestinal:  ( - ) nausea, ( - ) heartburn, ( - ) change in bowel habits Skin: ( - ) abnormal skin rashes Lymphatics: ( - ) new lymphadenopathy, ( - ) easy bruising Neurological: ( - ) numbness, ( - ) tingling, ( - ) new weaknesses Behavioral/Psych: ( - ) mood change, ( - ) new changes  All other systems were reviewed with the patient and are negative.  PHYSICAL EXAMINATION:  Vitals:   03/27/23 1421  BP: (!) 151/81  Pulse: 74  Resp: 14  Temp: (!) 97.3 F (36.3 C)  SpO2: 98%   Filed Weights   03/27/23 1421  Weight: 121 lb 1.6 oz (54.9 kg)    GENERAL: well appearing elderly Caucasian female, alert, no distress and comfortable SKIN: skin color, texture, turgor are normal, no rashes or significant lesions EYES: conjunctiva are pink and non-injected, sclera clear LUNGS: clear to auscultation and percussion with normal breathing effort HEART: regular rate & rhythm and no murmurs and no lower  extremity edema Musculoskeletal: no cyanosis of digits and no clubbing  PSYCH: alert & oriented x 3, fluent speech NEURO: no focal motor/sensory deficits  LABORATORY DATA:  I have reviewed the data as listed    Latest Ref Rng & Units 03/24/2023    1:54 PM 02/27/2023    8:39 AM 02/13/2023    7:47 AM  CBC  WBC 4.0 - 10.5 K/uL 8.1  5.8  5.4   Hemoglobin 12.0 - 15.0 g/dL 84.6  96.2  95.2   Hematocrit 36.0 - 46.0 % 35.4  32.9  32.9   Platelets 150 - 400 K/uL 281  295  271        Latest Ref Rng & Units 03/24/2023    1:54 PM 02/27/2023    8:39 AM 02/13/2023    7:47 AM  CMP  Glucose 70 - 99 mg/dL 161  096  81   BUN 8 - 23 mg/dL 10  12  16    Creatinine 0.44 - 1.00 mg/dL 0.45  4.09  8.11   Sodium 135 - 145 mmol/L 140  137  139   Potassium 3.5 - 5.1 mmol/L 3.8  3.9  4.1   Chloride 98 - 111 mmol/L 105  102  106   CO2 22 - 32 mmol/L 28  27  26    Calcium 8.9 - 10.3 mg/dL 9.2  9.2  9.2   Total Protein 6.5 - 8.1 g/dL 6.7  6.4  6.6   Total Bilirubin 0.0 - 1.2 mg/dL 1.1  0.8  1.0   Alkaline Phos 38 - 126 U/L 67  57  60   AST 15 - 41 U/L 13  14  14    ALT 0 - 44 U/L 10  12  10      RADIOGRAPHIC STUDIES: No results found.  ASSESSMENT & PLAN Miranda Holland 78 y.o. female with medical history significant for elevated ferritin of unclear etiology who presents for a follow up visit.  # Elevated Ferritin -- Genetic testing shows no evidence of hereditary hemochromatosis, however liver biopsy shows mild iron deposition. -- Given her markedly elevated ferritin levels with no other clear etiology I recommend we proceed with phlebotomy at this time. -- We will plan to target a ferritin of less than 100 with every 2 week phlebotomies. -- Labs today show white blood cell count 8.1, Hgb 12.6, MCV 98.1, Plt 281.  Ferritin level pending today, last 283 on 01/09/2023. --We discussed with the patient that phlebotomy in this situation is controversial, however I do believe that there is minimal potential for  harm in proceeding with phlebotomy. -- Will have the patient return to clinic in 3 months time for labs and 6 months for clinic visit .  No orders of the defined types were placed in this encounter.   All questions were answered. The patient knows to call the clinic with any problems, questions or concerns.  A total of more than 25 minutes were spent on this encounter with face-to-face time and non-face-to-face time, including preparing to see the patient, ordering tests and/or medications, counseling the patient and coordination of care as outlined above.   Ulysees Barns, MD Department of Hematology/Oncology [facility] at [facility] Phone: [phone] Pager: [phone] Email: [email or web address]  03/27/2023 4:53 PM

## 2023-03-30 ENCOUNTER — Other Ambulatory Visit: Payer: Self-pay | Admitting: Family

## 2023-03-30 DIAGNOSIS — Z1231 Encounter for screening mammogram for malignant neoplasm of breast: Secondary | ICD-10-CM

## 2023-03-31 DIAGNOSIS — R350 Frequency of micturition: Secondary | ICD-10-CM | POA: Diagnosis not present

## 2023-03-31 DIAGNOSIS — R351 Nocturia: Secondary | ICD-10-CM | POA: Diagnosis not present

## 2023-03-31 DIAGNOSIS — N3946 Mixed incontinence: Secondary | ICD-10-CM | POA: Diagnosis not present

## 2023-04-07 ENCOUNTER — Inpatient Hospital Stay: Payer: Medicare Other

## 2023-04-10 ENCOUNTER — Inpatient Hospital Stay: Payer: Medicare Other

## 2023-04-10 ENCOUNTER — Inpatient Hospital Stay: Payer: Medicare Other | Admitting: Hematology and Oncology

## 2023-04-15 DIAGNOSIS — N3946 Mixed incontinence: Secondary | ICD-10-CM | POA: Diagnosis not present

## 2023-04-21 DIAGNOSIS — R350 Frequency of micturition: Secondary | ICD-10-CM | POA: Diagnosis not present

## 2023-04-21 DIAGNOSIS — N3946 Mixed incontinence: Secondary | ICD-10-CM | POA: Diagnosis not present

## 2023-04-23 DIAGNOSIS — N3946 Mixed incontinence: Secondary | ICD-10-CM | POA: Diagnosis not present

## 2023-05-04 ENCOUNTER — Ambulatory Visit: Payer: Medicare Other

## 2023-05-14 ENCOUNTER — Ambulatory Visit
Admission: RE | Admit: 2023-05-14 | Discharge: 2023-05-14 | Disposition: A | Discharge status: Home / Self Care | Source: Ambulatory Visit | Attending: Family | Admitting: Family

## 2023-05-14 DIAGNOSIS — Z1231 Encounter for screening mammogram for malignant neoplasm of breast: Secondary | ICD-10-CM | POA: Diagnosis not present

## 2023-05-19 ENCOUNTER — Encounter: Payer: Self-pay | Admitting: Family

## 2023-05-26 DIAGNOSIS — R350 Frequency of micturition: Secondary | ICD-10-CM | POA: Diagnosis not present

## 2023-05-26 DIAGNOSIS — N3946 Mixed incontinence: Secondary | ICD-10-CM | POA: Diagnosis not present

## 2023-06-23 ENCOUNTER — Inpatient Hospital Stay: Payer: Medicare Other | Attending: Hematology and Oncology

## 2023-06-23 DIAGNOSIS — R7989 Other specified abnormal findings of blood chemistry: Secondary | ICD-10-CM | POA: Insufficient documentation

## 2023-06-23 LAB — CMP (CANCER CENTER ONLY)
ALT: 11 U/L (ref 0–44)
AST: 14 U/L — ABNORMAL LOW (ref 15–41)
Albumin: 4.5 g/dL (ref 3.5–5.0)
Alkaline Phosphatase: 67 U/L (ref 38–126)
Anion gap: 4 — ABNORMAL LOW (ref 5–15)
BUN: 17 mg/dL (ref 8–23)
CO2: 28 mmol/L (ref 22–32)
Calcium: 9.1 mg/dL (ref 8.9–10.3)
Chloride: 105 mmol/L (ref 98–111)
Creatinine: 0.7 mg/dL (ref 0.44–1.00)
GFR, Estimated: >60 mL/min (ref >60)
Glucose, Bld: 92 mg/dL (ref 70–99)
Potassium: 4.5 mmol/L (ref 3.5–5.1)
Sodium: 137 mmol/L (ref 135–145)
Total Bilirubin: 1.1 mg/dL (ref 0.0–1.2)
Total Protein: 6.8 g/dL (ref 6.5–8.1)

## 2023-06-23 LAB — CBC WITH DIFFERENTIAL (CANCER CENTER ONLY)
Abs Immature Granulocytes: 0.02 10*3/uL (ref 0.00–0.07)
Basophils Absolute: 0.1 10*3/uL (ref 0.0–0.1)
Basophils Relative: 2 %
Eosinophils Absolute: 0.2 10*3/uL (ref 0.0–0.5)
Eosinophils Relative: 4 %
HCT: 32.3 % — ABNORMAL LOW (ref 36.0–46.0)
Hemoglobin: 12.1 g/dL (ref 12.0–15.0)
Immature Granulocytes: 0 %
Lymphocytes Relative: 33 %
Lymphs Abs: 1.9 10*3/uL (ref 0.7–4.0)
MCH: 36.3 pg — ABNORMAL HIGH (ref 26.0–34.0)
MCHC: 37.5 g/dL — ABNORMAL HIGH (ref 30.0–36.0)
MCV: 97 fL (ref 80.0–100.0)
Monocytes Absolute: 0.5 10*3/uL (ref 0.1–1.0)
Monocytes Relative: 9 %
Neutro Abs: 2.9 10*3/uL (ref 1.7–7.7)
Neutrophils Relative %: 52 %
Platelet Count: 263 10*3/uL (ref 150–400)
RBC: 3.33 MIL/uL — ABNORMAL LOW (ref 3.87–5.11)
RDW: 17.8 % — ABNORMAL HIGH (ref 11.5–15.5)
WBC Count: 5.6 10*3/uL (ref 4.0–10.5)
nRBC: 0.4 % — ABNORMAL HIGH (ref 0.0–0.2)

## 2023-06-23 LAB — IRON AND IRON BINDING CAPACITY (CC-WL,HP ONLY)
Iron: 184 ug/dL — ABNORMAL HIGH (ref 28–170)
Saturation Ratios: 56 % — ABNORMAL HIGH (ref 10.4–31.8)
TIBC: 329 ug/dL (ref 250–450)
UIBC: 145 ug/dL — ABNORMAL LOW (ref 148–442)

## 2023-06-23 LAB — FERRITIN: Ferritin: 297 ng/mL (ref 11–307)

## 2023-08-19 DIAGNOSIS — M1712 Unilateral primary osteoarthritis, left knee: Secondary | ICD-10-CM | POA: Diagnosis not present

## 2023-09-17 ENCOUNTER — Ambulatory Visit (INDEPENDENT_AMBULATORY_CARE_PROVIDER_SITE_OTHER): Admitting: Family

## 2023-09-17 ENCOUNTER — Encounter: Payer: Self-pay | Admitting: Family

## 2023-09-17 VITALS — BP 118/74 | HR 70 | Temp 98.4°F | Ht 62.0 in | Wt 120.6 lb

## 2023-09-17 DIAGNOSIS — D649 Anemia, unspecified: Secondary | ICD-10-CM | POA: Diagnosis not present

## 2023-09-17 DIAGNOSIS — K760 Fatty (change of) liver, not elsewhere classified: Secondary | ICD-10-CM

## 2023-09-17 DIAGNOSIS — E782 Mixed hyperlipidemia: Secondary | ICD-10-CM | POA: Diagnosis not present

## 2023-09-17 DIAGNOSIS — Z79899 Other long term (current) drug therapy: Secondary | ICD-10-CM | POA: Diagnosis not present

## 2023-09-17 DIAGNOSIS — F411 Generalized anxiety disorder: Secondary | ICD-10-CM

## 2023-09-17 DIAGNOSIS — G479 Sleep disorder, unspecified: Secondary | ICD-10-CM

## 2023-09-17 DIAGNOSIS — M816 Localized osteoporosis [Lequesne]: Secondary | ICD-10-CM | POA: Diagnosis not present

## 2023-09-17 DIAGNOSIS — E559 Vitamin D deficiency, unspecified: Secondary | ICD-10-CM | POA: Diagnosis not present

## 2023-09-17 LAB — LIPID PANEL
Cholesterol: 226 mg/dL — ABNORMAL HIGH (ref 0–200)
HDL: 99.4 mg/dL (ref >39.00)
LDL Cholesterol: 113 mg/dL — ABNORMAL HIGH (ref 0–99)
NonHDL: 127.03
Total CHOL/HDL Ratio: 2
Triglycerides: 72 mg/dL (ref 0.0–149.0)
VLDL: 14.4 mg/dL (ref 0.0–40.0)

## 2023-09-17 MED ORDER — ALPRAZOLAM 0.25 MG PO TABS
0.2500 mg | ORAL_TABLET | ORAL | 2 refills | Status: AC | PRN
Start: 2023-09-17 — End: ?

## 2023-09-17 NOTE — Progress Notes (Signed)
 Subjective:  Patient ID: Miranda Holland, female    DOB: 11/05/1945  Age: 78 y.o. MRN: [id number]  Patient Care Team: Corwin Antu, FNP as PCP - General (Family Medicine)   CC:  Chief Complaint  Patient presents with   Annual Exam    HPI Miranda Holland is a 77 y.o. female who presents today for an annual physical exam. She reports consuming a general diet. Walks regularly limited at times by left knee pain, being seen by orthopedist, water aerobics, golf She generally feels well. She reports sleeping fairly well. She does not have additional problems to discuss today.   Vision:Within last year Dental:Receives regular dental care  Mammogram: 05/14/23  Last pap: > 65 Colonoscopy: >75 Bone density scan:  Pt is with acute concerns.  Discussed the use of AI scribe software for clinical note transcription with the patient, who gave verbal consent to proceed.  History of Present Illness Miranda Holland is a 78 year old female who presents for follow-up on her liver health and medication management.  She has a history of high cholesterol and is currently taking pravastatin . She has been experimenting with taking her cholesterol medication every other day to observe its effect on her blood work.   A liver biopsy revealed some iron stores. She occasionally consumes a glass of wine. She reports no symptoms related to her liver condition. She avoids Tylenol but uses ibuprofen as needed.  She experiences constipation, which was worsened by sleep gummies she tried for insomnia. She has difficulty falling asleep about twice a week and has used melatonin in the past, which initially helped but later became less effective. She prefers to avoid regular medication use.    Advanced Directives Patient does have advanced directives. She does not have a copy in the electronic medical record.   DEPRESSION SCREENING    09/17/2023    8:54 AM 08/27/2022    8:12 AM 07/23/2021    9:28 AM  PHQ 2/9  Scores  PHQ - 2 Score 0 0 0  PHQ- 9 Score 1  1     ROS: Negative unless specifically indicated above in HPI.    Current Outpatient Medications:    cholecalciferol (VITAMIN D3) 25 MCG (1000 UNIT) tablet, Take 1,000 Units by mouth daily., Disp: , Rfl:    famotidine (PEPCID) 20 MG tablet, 1 tablet at bedtime as needed, Disp: , Rfl:    GEMTESA 75 MG TABS, Take 1 tablet by mouth daily., Disp: , Rfl:    pravastatin  (PRAVACHOL ) 20 MG tablet, Take 20 mg by mouth daily., Disp: , Rfl:    valACYclovir  (VALTREX ) 1000 MG tablet, Take 1 tablet (1,000 mg total) by mouth 2 (two) times daily., Disp: 30 tablet, Rfl: 2   ALPRAZolam  (XANAX ) 0.25 MG tablet, Take 1 tablet (0.25 mg total) by mouth as needed for sleep or anxiety., Disp: 30 tablet, Rfl: 2    Objective:    BP 118/74 (BP Location: Left Arm, Patient Position: Sitting, Cuff Size: Normal)   Pulse 70   Temp 98.4 F (36.9 C) (Temporal)   Ht 5' 2 (1.575 m)   Wt 120 lb 9.6 oz (54.7 kg)   SpO2 97%   BMI 22.06 kg/m   BP Readings from Last 3 Encounters:  09/17/23 118/74  03/27/23 (!) 151/81  02/27/23 (!) 143/70      Physical Exam CARDIOVASCULAR: Heart sounds normal. ABDOMEN: Abdomen non-tender. EXTREMITIES: No edema. Physical Exam Constitutional:      General: She is  not in acute distress.    Appearance: Normal appearance. She is normal weight. She is not ill-appearing.  HENT:     Head: Normocephalic.     Right Ear: Tympanic membrane normal.     Left Ear: Tympanic membrane normal.     Nose: Nose normal.     Mouth/Throat:     Mouth: Mucous membranes are moist.  Eyes:     Extraocular Movements: Extraocular movements intact.     Pupils: Pupils are equal, round, and reactive to light.  Cardiovascular:     Rate and Rhythm: Normal rate and regular rhythm.  Pulmonary:     Effort: Pulmonary effort is normal.     Breath sounds: Normal breath sounds.  Abdominal:     General: Abdomen is flat. Bowel sounds are normal.     Palpations:  Abdomen is soft.     Tenderness: There is no guarding or rebound.  Musculoskeletal:        General: Normal range of motion.     Cervical back: Normal range of motion.  Skin:    General: Skin is warm.     Capillary Refill: Capillary refill takes less than 2 seconds.  Neurological:     General: No focal deficit present.     Mental Status: She is alert.  Psychiatric:        Mood and Affect: Mood normal.        Behavior: Behavior normal.        Thought Content: Thought content normal.        Judgment: Judgment normal.      Results LABS Ferritin: 600  PATHOLOGY Liver biopsy: No cirrhosis, no steatosis, minimal iron stores      Assessment & Plan:   Assessment and Plan Assessment & Plan   Sleep disturbance Difficulty falling asleep approximately twice a week. Melatonin was initially effective but later caused headaches and became ineffective. - Consider using melatonin or other sleep aids only on nights with prolonged difficulty falling asleep. -recommended trazodone however pt declines for now  Mixed hyperlipidemia Managed with pravastatin , taken every other day to evaluate impact on cholesterol levels. Upcoming blood work will provide further insights. - Check cholesterol levels in upcoming blood work.  Iron overload with anemia Cont f/u with hematology as scheduled  History of fatty liver Previous liver biopsy showed no cirrhosis or fatty liver, only minimal iron stores. Liver function tests are normal. - Continue dietary restrictions to support liver health, including limiting vitamin C supplements, raw shellfish, and alcohol intake.  Encounter for general adult exam with abnormal findings Patient Counseling(The following topics were reviewed):  Preventative care handout given to pt  Health maintenance and immunizations reviewed. Please refer to Health maintenance section. Pt advised on safe sex, wearing seatbelts in car, and proper nutrition labwork ordered  today for annual Dental health: Discussed importance of regular tooth brushing, flossing, and dental visits.   Recording duration: 7 minutes   Patient Counseling(The following topics were reviewed):  Preventative care handout given to pt  Health maintenance and immunizations reviewed. Please refer to Health maintenance section. Pt advised on safe sex, wearing seatbelts in car, and proper nutrition labwork ordered today for annual Dental health: Discussed importance of regular tooth brushing, flossing, and dental visits.      Follow-up: Return in about 1 year (around 09/16/2024) for f/u CPE.   Ginger Patrick, FNP

## 2023-09-17 NOTE — Progress Notes (Deleted)
 Subjective:  Patient ID: Mkayla L Alford, female    DOB: 08/02/1945  Age: 78 y.o. MRN: [id number]  Patient Care Team: Corwin Antu, FNP as PCP - General (Family Medicine)   CC:  Chief Complaint  Patient presents with   Annual Exam    HPI Tun[place] L Bracy is a 78 y.o. female who presents today for an annual physical exam. She reports consuming a {diet types:17450} diet. {types:19826} She generally feels {DESC; WELL/FAIRLY WELL/POORLY:18703}. She reports sleeping {DESC; WELL/FAIRLY WELL/POORLY:18703}. She {does/does not:200015} have additional problems to discuss today.   {Vision/Dental/STD/PSA (Optional):27375}  Lung Cancer Screening with low-dose Chest CT: *** - Adults age 4-80 who are current cigarette smokers or quit within the last 15 years. Must have 20 pack year history.  AAA Screening: *** - Men age 33-75 who have ever smoked  Mammogram:  Last pap:  Colonoscopy: Bone density scan:  Pt is with*** acute concerns.   Advanced Directives Patient {does/does not:200015} have advanced directives including {BJACP:22709}. She {does/does not:200015} have a copy in the electronic medical record.   DEPRESSION SCREENING    09/17/2023    8:54 AM 08/27/2022    8:12 AM 07/23/2021    9:28 AM  PHQ 2/9 Scores  PHQ - 2 Score 0 0 0  PHQ- 9 Score 1  1     ROS: Negative unless specifically indicated above in HPI.    Current Outpatient Medications:    cholecalciferol (VITAMIN D3) 25 MCG (1000 UNIT) tablet, Take 1,000 Units by mouth daily., Disp: , Rfl:    famotidine (PEPCID) 20 MG tablet, 1 tablet at bedtime as needed, Disp: , Rfl:    GEMTESA 75 MG TABS, Take 1 tablet by mouth daily., Disp: , Rfl:    pravastatin  (PRAVACHOL ) 20 MG tablet, Take 20 mg by mouth daily., Disp: , Rfl:    valACYclovir  (VALTREX ) 1000 MG tablet, Take 1 tablet (1,000 mg total) by mouth 2 (two) times daily., Disp: 30 tablet, Rfl: 2   ALPRAZolam  (XANAX ) 0.25 MG tablet, Take 1 tablet (0.25 mg total) by mouth as  needed for sleep or anxiety., Disp: 30 tablet, Rfl: 2    Objective:    BP 118/74 (BP Location: Left Arm, Patient Position: Sitting, Cuff Size: Normal)   Pulse 70   Temp 98.4 F (36.9 C) (Temporal)   Ht 5' 2 (1.575 m)   Wt 120 lb 9.6 oz (54.7 kg)   SpO2 97%   BMI 22.06 kg/m   {Vitals History (Optional):23777}  Physical Exam    Results       Assessment & Plan:  Assessment & Plan:   Assessment and Plan Assessment & Plan       I have independently evaluated patient.  Latese L Jiles is a 77 y.o. female who is *** risk for a *** risk surgery.  There {Actions; are/are not:16769} modifiable risk factors (smoking, etc). Whitnie L Wrightson's RCRI/NSQIP calculation for MACE is: ***.    Review meds: If indicated, NO ACE-I/ARB day of surgery. OK to do BB or statin day of (esp if vascular sx)  Return in about 1 year (around 09/16/2024) for f/u CPE.  Antu Corwin, MSN, APRN, FNP-C [facility]  Subjective:      HPI: Pt is a 78 y.o. female who is here for preoperative clearance for *** with *** anesthesia  Discussed the use of AI scribe software for clinical note transcription with the patient, who gave verbal consent to proceed.  History of Present Illness  1) High Risk Cardiac Conditions:  1) Recent MI - {yes no:314532}  2) Decompensated Heart Failure - {yes no:314532}  3) Unstable angina - {yes no:314532}  4) Symptomatic arrythmia - {yes no:314532}  5) Sx Valvular Disease - {yes no:314532}  2) Intermediate Risk Factors: DM, CKD, CVA, CHF, CAD - {yes no:314532}  2) Functional Status: > 4 mets (Walk, run, climb stairs) {yes no:314532}. [facility] Activity Status Index: ***  3) Surgery Specific Risk: High (Emergency, Vascular, Intra-abdominal, Extensive ops)          Intermediate (Carotid, Head and Neck, Orthopaedic )          Low (Endoscopic, Cataract, Breast )  4) Further Noninvasive evaluation:   1) EKG - {yes no:314532}   Hx of MI,  CVA, CAD, DM, CKD  2) Echo - {yes no:314532}   Worsening dyspnea or CHF without an echo in the past year  3) Stress Testing - Active Cardiac Disease - {yes no:314532}  4) CXR {yes no:314532}   If asymptomatic, healthy, no respiratory symptoms it is not needed  5)PFTs {yes no:314532}   OSA, OHS, or significant cardiopulmonary history  5) Need for medical therapy - Beta Blocker, Statins indicated ? {yes wn:685467}  \    Social history:  Relevant past medical, surgical, family and social history reviewed and updated as indicated. Interim medical history since our last visit reviewed.  Allergies and medications reviewed and updated.  DATA REVIEWED: CHART IN EPIC  ROS: Negative unless specifically indicated above in HPI.    Current Outpatient Medications:    cholecalciferol (VITAMIN D3) 25 MCG (1000 UNIT) tablet, Take 1,000 Units by mouth daily., Disp: , Rfl:    famotidine (PEPCID) 20 MG tablet, 1 tablet at bedtime as needed, Disp: , Rfl:    GEMTESA 75 MG TABS, Take 1 tablet by mouth daily., Disp: , Rfl:    pravastatin  (PRAVACHOL ) 20 MG tablet, Take 20 mg by mouth daily., Disp: , Rfl:    valACYclovir  (VALTREX ) 1000 MG tablet, Take 1 tablet (1,000 mg total) by mouth 2 (two) times daily., Disp: 30 tablet, Rfl: 2   ALPRAZolam  (XANAX ) 0.25 MG tablet, Take 1 tablet (0.25 mg total) by mouth as needed for sleep or anxiety., Disp: 30 tablet, Rfl: 2      Objective:    BP 118/74 (BP Location: Left Arm, Patient Position: Sitting, Cuff Size: Normal)   Pulse 70   Temp 98.4 F (36.9 C) (Temporal)   Ht 5' 2 (1.575 m)   Wt 120 lb 9.6 oz (54.7 kg)   SpO2 97%   BMI 22.06 kg/m   Wt Readings from Last 3 Encounters:  09/17/23 120 lb 9.6 oz (54.7 kg)  03/27/23 121 lb 1.6 oz (54.9 kg)  01/23/23 119 lb 12.8 oz (54.3 kg)    Physical Exam        Results       Assessment and Plan Assessment & Plan          Follow-up: Return in about 1 year (around 09/16/2024) for f/u CPE.    Ginger Patrick, FNP

## 2023-09-18 ENCOUNTER — Other Ambulatory Visit: Payer: Self-pay

## 2023-09-18 DIAGNOSIS — R1110 Vomiting, unspecified: Secondary | ICD-10-CM | POA: Insufficient documentation

## 2023-09-18 DIAGNOSIS — Z5321 Procedure and treatment not carried out due to patient leaving prior to being seen by health care provider: Secondary | ICD-10-CM | POA: Diagnosis not present

## 2023-09-18 DIAGNOSIS — R55 Syncope and collapse: Secondary | ICD-10-CM | POA: Diagnosis not present

## 2023-09-18 LAB — CBC
HCT: 31.8 % — ABNORMAL LOW (ref 36.0–46.0)
Hemoglobin: 11.7 g/dL — ABNORMAL LOW (ref 12.0–15.0)
MCH: 36.6 pg — ABNORMAL HIGH (ref 26.0–34.0)
MCHC: 36.8 g/dL — ABNORMAL HIGH (ref 30.0–36.0)
MCV: 99.4 fL (ref 80.0–100.0)
Platelets: 259 K/uL (ref 150–400)
RBC: 3.2 MIL/uL — ABNORMAL LOW (ref 3.87–5.11)
RDW: 19 % — ABNORMAL HIGH (ref 11.5–15.5)
WBC: 17.3 K/uL — ABNORMAL HIGH (ref 4.0–10.5)
nRBC: 0 % (ref 0.0–0.2)

## 2023-09-18 LAB — COMPREHENSIVE METABOLIC PANEL WITH GFR
ALT: 14 U/L (ref 0–44)
AST: 19 U/L (ref 15–41)
Albumin: 4.2 g/dL (ref 3.5–5.0)
Alkaline Phosphatase: 54 U/L (ref 38–126)
Anion gap: 11 (ref 5–15)
BUN: 12 mg/dL (ref 8–23)
CO2: 23 mmol/L (ref 22–32)
Calcium: 8.8 mg/dL — ABNORMAL LOW (ref 8.9–10.3)
Chloride: 101 mmol/L (ref 98–111)
Creatinine, Ser: 0.67 mg/dL (ref 0.44–1.00)
GFR, Estimated: >60 mL/min (ref >60)
Glucose, Bld: 149 mg/dL — ABNORMAL HIGH (ref 70–99)
Potassium: 3.7 mmol/L (ref 3.5–5.1)
Sodium: 135 mmol/L (ref 135–145)
Total Bilirubin: 1 mg/dL (ref 0.0–1.2)
Total Protein: 6.6 g/dL (ref 6.5–8.1)

## 2023-09-18 LAB — DRUG MONITORING, PANEL 8 WITH CONFIRMATION, URINE
6 Acetylmorphine: NEGATIVE ng/mL (ref <10)
Alcohol Metabolites: NEGATIVE ng/mL (ref <500)
Amphetamines: NEGATIVE ng/mL (ref <500)
Benzodiazepines: NEGATIVE ng/mL (ref <100)
Buprenorphine, Urine: NEGATIVE ng/mL (ref <5)
Cocaine Metabolite: NEGATIVE ng/mL (ref <150)
Creatinine: 26.5 mg/dL (ref >=20.0)
MDMA: NEGATIVE ng/mL (ref <500)
Marijuana Metabolite: NEGATIVE ng/mL (ref <20)
Opiates: NEGATIVE ng/mL (ref <100)
Oxidant: NEGATIVE ug/mL (ref <200)
Oxycodone: NEGATIVE ng/mL (ref <100)
pH: 7.2 (ref 4.5–9.0)

## 2023-09-18 LAB — CBG MONITORING, ED: Glucose-Capillary: 141 mg/dL — ABNORMAL HIGH (ref 70–99)

## 2023-09-18 LAB — DM TEMPLATE

## 2023-09-18 MED ORDER — ONDANSETRON 4 MG PO TBDP
4.0000 mg | ORAL_TABLET | Freq: Once | ORAL | Status: AC
  Administered 2023-09-18: 4 mg via ORAL
  Filled 2023-09-18: qty 1

## 2023-09-18 NOTE — ED Notes (Signed)
 Pt to desk and advised she is leaving. Pt educated about her abnormal lab work and was encouraged to stay.

## 2023-09-18 NOTE — ED Triage Notes (Signed)
 Pt arrives with c/o n/v after giving blood today. Per pt, she gave blood and then went to play golf and had a syncopal episode. Per pt, after the syncopal episode she started to vomit. Pt a&ox4. Pt did not hit her head when she LOC.

## 2023-09-19 ENCOUNTER — Emergency Department
Admission: EM | Admit: 2023-09-19 | Discharge: 2023-09-19 | Disposition: A | Discharge status: Home / Self Care | Attending: Emergency Medicine | Admitting: Emergency Medicine

## 2023-09-19 LAB — URINALYSIS, ROUTINE W REFLEX MICROSCOPIC
Bacteria, UA: NONE SEEN
Bilirubin Urine: NEGATIVE
Glucose, UA: NEGATIVE mg/dL
Hgb urine dipstick: NEGATIVE
Ketones, ur: NEGATIVE mg/dL
Nitrite: NEGATIVE
Protein, ur: NEGATIVE mg/dL
Specific Gravity, Urine: 1.017 (ref 1.005–1.030)
pH: 5 (ref 5.0–8.0)

## 2023-09-19 NOTE — ED Notes (Signed)
 Pt to desk again and advised she is leaving and will follow up with her PCP tomorrow.

## 2023-09-20 ENCOUNTER — Ambulatory Visit
Admission: EM | Admit: 2023-09-20 | Discharge: 2023-09-20 | Disposition: A | Discharge status: Home / Self Care | Attending: Emergency Medicine | Admitting: Emergency Medicine

## 2023-09-20 ENCOUNTER — Encounter: Payer: Self-pay | Admitting: Emergency Medicine

## 2023-09-20 DIAGNOSIS — R55 Syncope and collapse: Secondary | ICD-10-CM | POA: Diagnosis not present

## 2023-09-20 DIAGNOSIS — R5381 Other malaise: Secondary | ICD-10-CM | POA: Diagnosis not present

## 2023-09-20 LAB — POCT URINE DIPSTICK
Bilirubin, UA: NEGATIVE
Blood, UA: NEGATIVE
Glucose, UA: NEGATIVE mg/dL
Ketones, POC UA: NEGATIVE mg/dL
Leukocytes, UA: NEGATIVE
Nitrite, UA: NEGATIVE
POC PROTEIN,UA: NEGATIVE
Spec Grav, UA: 1.01 (ref 1.010–1.025)
Urobilinogen, UA: 0.2 U/dL
pH, UA: 6.5 (ref 5.0–8.0)

## 2023-09-20 NOTE — Discharge Instructions (Signed)
 Today you are evaluated for your continued headache and general malaise, this is most likely related to persistent vomiting and syncopal episode  Urinalysis today is negative for bladder infection, has been sent to the lab to culture, pending for 3 days she will be notified of any positive results and treatment discussed at time of notification  Would like you to notify your primary doctor on Monday of your concerns and they will guide you if further evaluation is needed  Continue to rehydrate the body using water and electrolyte based products

## 2023-09-20 NOTE — ED Triage Notes (Signed)
 Patient reports head ache.Denies urinary symptoms. Patient complained of head ache x 1 day. Patient also had fainting episode after donated  blood 5 hours on friday . Went to ear for symptoms.

## 2023-09-20 NOTE — ED Provider Notes (Addendum)
 CAY RALPH PELT    CSN: [id number] Arrival date & time: 09/20/23  9183      History   Chief Complaint No chief complaint on file.   HPI Miranda Holland is a 78 y.o. female.   Patient presents for concern of urinary infection.  Went to the emergency department 2 days ago after syncopal episode that occurred after giving blood.  Had vomiting after regaining of consciousness which she endorses has occurred before when this is happening.  Was triaged and lab work obtained but left prior to seeing Dr., Prior to discharge was told by a staff member that her urinalysis was abnormal and was concerning for infection.  Attempted to be seen by her primary doctor but office is closed on the weekend.  Denies symptoms.  Endorses malaise and intermittent headache.  Past Medical History:  Diagnosis Date   Arthritis    OSTEO   Hypertension    STD (sexually transmitted disease)    HSV 1    Patient Active Problem List   Diagnosis Date Noted   Sleep disorder 09/17/2023   Fatty liver 08/27/2022   Pelvic congestion syndrome 08/27/2022   Hiatal hernia 08/27/2022   Epigastric pain 08/27/2022   Gastrointestinal food sensitivity 08/27/2022   Vitamin D  deficiency 12/02/2021   Localized osteoporosis without current pathological fracture 12/02/2021   Elevated ferritin 12/02/2021   Anxiety state 07/23/2021   Heartburn 07/23/2021   Mixed hyperlipidemia 07/23/2021   Anemia 07/23/2021    Past Surgical History:  Procedure Laterality Date    submucous myoma, resection  1997   BLADDER REPAIR  2007   with mesh, from child bearing   BREAST EXCISIONAL BIOPSY Left 1987   benign   BREAST SURGERY  1987   Lt Breast Bx Benign   Caratact Bilateral 05/2022   THYROIDECTOMY  1991   hemi(left)   TOE FUSION  01/2012   LEFT Big Toe   TUBAL LIGATION      OB History     Gravida  2   Para  2   Term      Preterm      AB      Living  2      SAB      IAB      Ectopic      Multiple       Live Births               Home Medications    Prior to Admission medications   Medication Sig Start Date End Date Taking? Authorizing Provider  ALPRAZolam  (XANAX ) 0.25 MG tablet Take 1 tablet (0.25 mg total) by mouth as needed for sleep or anxiety. 09/17/23   Dugal, Tabitha, FNP  cholecalciferol (VITAMIN D3) 25 MCG (1000 UNIT) tablet Take 1,000 Units by mouth daily.    [provider]  famotidine (PEPCID) 20 MG tablet 1 tablet at bedtime as needed 05/20/19   [provider]  GEMTESA 75 MG TABS Take 1 tablet by mouth daily.    [provider]  pravastatin  (PRAVACHOL ) 20 MG tablet Take 20 mg by mouth daily.    [provider]  valACYclovir  (VALTREX ) 1000 MG tablet Take 1 tablet (1,000 mg total) by mouth 2 (two) times daily. 06/22/12   Romine, Montie SQUIBB, MD    Family History Family History  Problem Relation Age of Onset   Diabetes Mother    Heart disease Mother    Cirrhosis Father    Alcoholism Father  Hypertension Sister    Heart disease Brother    Breast cancer Paternal Aunt     Social History Social History   Tobacco Use   Smoking status: Never   Smokeless tobacco: Never  Vaping Use   Vaping status: Never Used  Substance Use Topics   Alcohol use: Not Currently    Alcohol/week: 21.0 standard drinks of alcohol    Types: 14 Glasses of wine, 7 Unspecified drink type per week    Comment: however stopped drinking may 1   Drug use: No     Allergies   Meperidine hcl and Oxycodone hcl   Review of Systems Review of Systems   Physical Exam Triage Vital Signs ED Triage Vitals  Encounter Vitals Group     BP --      Girls Systolic BP Percentile --      Girls Diastolic BP Percentile --      Boys Systolic BP Percentile --      Boys Diastolic BP Percentile --      Pulse --      Resp --      Temp --      Temp src --      SpO2 09/20/23 0852 98 %     Weight --      Height --      Head Circumference --      Peak Flow --       Pain Score 09/20/23 0851 0     Pain Loc --      Pain Education --      Exclude from Growth Chart --    No data found.  Updated Vital Signs SpO2 98%   Visual Acuity Right Eye Distance:   Left Eye Distance:   Bilateral Distance:    Right Eye Near:   Left Eye Near:    Bilateral Near:     Physical Exam Constitutional:      Appearance: Normal appearance.  Eyes:     Extraocular Movements: Extraocular movements intact.  Pulmonary:     Effort: Pulmonary effort is normal.  Neurological:     General: No focal deficit present.     Mental Status: She is alert and oriented to person, place, and time. Mental status is at baseline.      UC Treatments / Results  Labs (all labs ordered are listed, but only abnormal results are displayed) Labs Reviewed  URINE CULTURE  POCT URINE DIPSTICK    EKG   Radiology No results found.  Procedures Procedures (including critical care time)  Medications Ordered in UC Medications - No data to display  Initial Impression / Assessment and Plan / UC Course  I have reviewed the triage vital signs and the nursing notes.  Pertinent labs & imaging results that were available during my care of the patient were reviewed by me and considered in my medical decision making (see chart for details).  Malaise   Vitals are stable, patient no signs of distress no neurological deficits, urinalysis is negative, per chart review urinalysis 2 days ago showing Mikey Maffett blood cells but negative for bacteria, discussed the findings with patient, urine culture pending will initiate antibiotics based on results at this time advised patient to monitor and rehydrate with follow-up appointment with primary doctor Final Clinical Impressions(s) / UC Diagnoses   Final diagnoses:  Syncope, unspecified syncope type  [facility]     Discharge Instructions      Today you are evaluated for your continued headache and  general malaise, this is most likely related to  persistent vomiting and syncopal episode  Urinalysis today is negative for bladder infection, has been sent to the lab to culture, pending for 3 days she will be notified of any positive results and treatment discussed at time of notification  Would like you to notify your primary doctor on Monday of your concerns and they will guide you if further evaluation is needed  Continue to rehydrate the body using water and electrolyte based products   ED Prescriptions   None    PDMP not reviewed this encounter.   Teresa Shelba SAUNDERS, NP 09/20/23 0957    Teresa Shelba SAUNDERS, NP 09/20/23 [phone]

## 2023-09-21 ENCOUNTER — Ambulatory Visit: Payer: Self-pay | Admitting: Family

## 2023-09-21 ENCOUNTER — Other Ambulatory Visit: Payer: Self-pay | Admitting: Family

## 2023-09-21 DIAGNOSIS — E782 Mixed hyperlipidemia: Secondary | ICD-10-CM

## 2023-09-21 LAB — URINE CULTURE: Culture: <10000 — AB

## 2023-09-21 MED ORDER — PRAVASTATIN SODIUM 20 MG PO TABS: 20.0000 mg | ORAL_TABLET | Freq: Every day | ORAL | Status: DC

## 2023-09-21 MED ORDER — PRAVASTATIN SODIUM 40 MG PO TABS: 40.0000 mg | ORAL_TABLET | Freq: Every day | ORAL | 3 refills | Status: DC

## 2023-09-22 ENCOUNTER — Ambulatory Visit (HOSPITAL_COMMUNITY): Payer: Self-pay

## 2023-09-24 ENCOUNTER — Inpatient Hospital Stay: Payer: Medicare Other | Attending: Hematology and Oncology

## 2023-09-24 ENCOUNTER — Other Ambulatory Visit: Payer: Self-pay | Admitting: Hematology and Oncology

## 2023-09-24 DIAGNOSIS — Z79899 Other long term (current) drug therapy: Secondary | ICD-10-CM | POA: Diagnosis not present

## 2023-09-24 DIAGNOSIS — R7989 Other specified abnormal findings of blood chemistry: Secondary | ICD-10-CM | POA: Insufficient documentation

## 2023-09-24 DIAGNOSIS — Z79624 Long term (current) use of inhibitors of nucleotide synthesis: Secondary | ICD-10-CM | POA: Diagnosis not present

## 2023-09-24 DIAGNOSIS — I10 Essential (primary) hypertension: Secondary | ICD-10-CM | POA: Diagnosis not present

## 2023-09-24 LAB — CMP (CANCER CENTER ONLY)
ALT: 12 U/L (ref 0–44)
AST: 16 U/L (ref 15–41)
Albumin: 4.4 g/dL (ref 3.5–5.0)
Alkaline Phosphatase: 57 U/L (ref 38–126)
Anion gap: 5 (ref 5–15)
BUN: 16 mg/dL (ref 8–23)
CO2: 27 mmol/L (ref 22–32)
Calcium: 9.1 mg/dL (ref 8.9–10.3)
Chloride: 105 mmol/L (ref 98–111)
Creatinine: 0.73 mg/dL (ref 0.44–1.00)
GFR, Estimated: >60 mL/min (ref >60)
Glucose, Bld: 88 mg/dL (ref 70–99)
Potassium: 4.3 mmol/L (ref 3.5–5.1)
Sodium: 137 mmol/L (ref 135–145)
Total Bilirubin: 1.1 mg/dL (ref 0.0–1.2)
Total Protein: 6.5 g/dL (ref 6.5–8.1)

## 2023-09-24 LAB — CBC WITH DIFFERENTIAL (CANCER CENTER ONLY)
Abs Immature Granulocytes: 0.05 K/uL (ref 0.00–0.07)
Basophils Absolute: 0.1 K/uL (ref 0.0–0.1)
Basophils Relative: 2 %
Eosinophils Absolute: 0.2 K/uL (ref 0.0–0.5)
Eosinophils Relative: 4 %
HCT: 31.1 % — ABNORMAL LOW (ref 36.0–46.0)
Hemoglobin: 11.3 g/dL — ABNORMAL LOW (ref 12.0–15.0)
Immature Granulocytes: 1 %
Lymphocytes Relative: 27 %
Lymphs Abs: 1.7 K/uL (ref 0.7–4.0)
MCH: 36.1 pg — ABNORMAL HIGH (ref 26.0–34.0)
MCHC: 36.3 g/dL — ABNORMAL HIGH (ref 30.0–36.0)
MCV: 99.4 fL (ref 80.0–100.0)
Monocytes Absolute: 0.5 K/uL (ref 0.1–1.0)
Monocytes Relative: 9 %
Neutro Abs: 3.5 K/uL (ref 1.7–7.7)
Neutrophils Relative %: 57 %
Platelet Count: 286 K/uL (ref 150–400)
RBC: 3.13 MIL/uL — ABNORMAL LOW (ref 3.87–5.11)
RDW: 19.1 % — ABNORMAL HIGH (ref 11.5–15.5)
WBC Count: 6.1 K/uL (ref 4.0–10.5)
nRBC: 0.5 % — ABNORMAL HIGH (ref 0.0–0.2)

## 2023-09-24 LAB — IRON AND IRON BINDING CAPACITY (CC-WL,HP ONLY)
Iron: 111 ug/dL (ref 28–170)
Saturation Ratios: 31 % (ref 10.4–31.8)
TIBC: 360 ug/dL (ref 250–450)
UIBC: 249 ug/dL (ref 148–442)

## 2023-09-24 LAB — FERRITIN: Ferritin: 147 ng/mL (ref 11–307)

## 2023-10-01 ENCOUNTER — Inpatient Hospital Stay (HOSPITAL_BASED_OUTPATIENT_CLINIC_OR_DEPARTMENT_OTHER): Payer: Medicare Other | Admitting: Hematology and Oncology

## 2023-10-01 VITALS — BP 132/81 | HR 65 | Temp 97.7°F | Resp 13 | Wt 122.2 lb

## 2023-10-01 DIAGNOSIS — R7989 Other specified abnormal findings of blood chemistry: Secondary | ICD-10-CM

## 2023-10-01 DIAGNOSIS — Z79624 Long term (current) use of inhibitors of nucleotide synthesis: Secondary | ICD-10-CM | POA: Diagnosis not present

## 2023-10-01 DIAGNOSIS — I10 Essential (primary) hypertension: Secondary | ICD-10-CM | POA: Diagnosis not present

## 2023-10-01 DIAGNOSIS — Z79899 Other long term (current) drug therapy: Secondary | ICD-10-CM | POA: Diagnosis not present

## 2023-10-01 NOTE — Progress Notes (Signed)
 [facility] [phone]   [phone]  PROGRESS NOTE  Patient Care Team: Corwin Antu, FNP as PCP - General (Family Medicine)  Hematological/Oncological History # Elevated Ferritin  07/23/2021: WBC 5.3, hgb 13.4, MCV 105.2, Plt 265, Ferritin 494.5, Sat 49.9%, iron 162.  08/01/2021: US  abdomen showed no evidence of abnormality in the liver/gallbladder  09/11/2021: establish care with Dr. Federico. 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  12/05/2022: start therapeutic phlebotomy q 2 weeks.   Interval History:  Miranda Holland 78 y.o. female with medical history significant for elevated ferritin of unclear etiology who presents for a follow up visit. The patient's last visit was on 03/27/2023. In the interim since the last visit she has undergone every 2 week phlebotomy.  On exam today Miranda Holland reports that next week she will be traveling to [place]  in [place].  She reports her energy has been okay in the interim since her last visit.  She has donated blood twice since we last talked.  She reports that unfortunately after her last blood donation she had a fainting episode 5 to 6 hours later.  She reports this is because she went out golfing with her husband on a hot day.  She reports that she was taken to the ER in [place] and unfortunately did not get any intention for 5 hours and went home and decided to hydrate by herself.  She reports that she can donate up to every 8 weeks.  She reports she has had no other illnesses in the interim since her last visit.  She denies any runny nose, sore throat, or cough.  She reports that she has good appetite though her energy levels are middling today.  Overall she feels well and has no questions concerns or complaints.  A full 10 point ROS is otherwise negative.  MEDICAL HISTORY:  Past Medical History:  Diagnosis Date   Arthritis    OSTEO    Hypertension    STD (sexually transmitted disease)    HSV 1    SURGICAL HISTORY: Past Surgical History:  Procedure Laterality Date    submucous myoma, resection  1997   BLADDER REPAIR  2007   with mesh, from child bearing   BREAST EXCISIONAL BIOPSY Left 1987   benign   BREAST SURGERY  1987   Lt Breast Bx Benign   Caratact Bilateral 05/2022   THYROIDECTOMY  1991   hemi(left)   TOE FUSION  01/2012   LEFT Big Toe   TUBAL LIGATION      SOCIAL HISTORY: Social History   Socioeconomic History   Marital status: Married    Spouse name: Not on file   Number of children: 2   Years of education: Not on file   Highest education level: Not on file  Occupational History   Occupation: retired Engineer, civil (consulting)  Tobacco Use   Smoking status: Never   Smokeless tobacco: Never  Vaping Use   Vaping status: Never Used  Substance and Sexual Activity   Alcohol use: Not Currently    Alcohol/week: 21.0 standard drinks of alcohol    Types: 14 Glasses of wine, 7 Unspecified drink type per week    Comment: however stopped drinking may 1   Drug use: No   Sexual activity: Yes    Partners: Male    Birth control/protection: Post-menopausal  Other Topics Concern   Not on file  Social History Narrative   Two girls  Social Drivers of Corporate investment banker Strain: Not on file  Food Insecurity: Not on file  Transportation Needs: Not on file  Physical Activity: Not on file  Stress: Not on file  Social Connections: Not on file  Intimate Partner Violence: Not on file    FAMILY HISTORY: Family History  Problem Relation Age of Onset   Diabetes Mother    Heart disease Mother    Cirrhosis Father    Alcoholism Father    Hypertension Sister    Heart disease Brother    Breast cancer Paternal Aunt     ALLERGIES:  is allergic to meperidine hcl and oxycodone hcl.  MEDICATIONS:  Current Outpatient Medications  Medication Sig Dispense Refill   ALPRAZolam  (XANAX ) 0.25 MG tablet Take 1 tablet  (0.25 mg total) by mouth as needed for sleep or anxiety. 30 tablet 2   cholecalciferol (VITAMIN D3) 25 MCG (1000 UNIT) tablet Take 1,000 Units by mouth daily.     famotidine (PEPCID) 20 MG tablet 1 tablet at bedtime as needed     GEMTESA 75 MG TABS Take 1 tablet by mouth daily.     pravastatin  (PRAVACHOL ) 20 MG tablet Take 1 tablet (20 mg total) by mouth daily.     valACYclovir  (VALTREX ) 1000 MG tablet Take 1 tablet (1,000 mg total) by mouth 2 (two) times daily. 30 tablet 2   No current facility-administered medications for this visit.    REVIEW OF SYSTEMS:   Constitutional: ( - ) fevers, ( - )  chills , ( - ) night sweats Eyes: ( - ) blurriness of vision, ( - ) double vision, ( - ) watery eyes Ears, nose, mouth, throat, and face: ( - ) mucositis, ( - ) sore throat Respiratory: ( - ) cough, ( - ) dyspnea, ( - ) wheezes Cardiovascular: ( - ) palpitation, ( - ) chest discomfort, ( - ) lower extremity swelling Gastrointestinal:  ( - ) nausea, ( - ) heartburn, ( - ) change in bowel habits Skin: ( - ) abnormal skin rashes Lymphatics: ( - ) new lymphadenopathy, ( - ) easy bruising Neurological: ( - ) numbness, ( - ) tingling, ( - ) new weaknesses Behavioral/Psych: ( - ) mood change, ( - ) new changes  All other systems were reviewed with the patient and are negative.  PHYSICAL EXAMINATION:  There were no vitals filed for this visit.  There were no vitals filed for this visit.   GENERAL: well appearing elderly Caucasian female, alert, no distress and comfortable SKIN: skin color, texture, turgor are normal, no rashes or significant lesions EYES: conjunctiva are pink and non-injected, sclera clear LUNGS: clear to auscultation and percussion with normal breathing effort HEART: regular rate & rhythm and no murmurs and no lower extremity edema Musculoskeletal: no cyanosis of digits and no clubbing  PSYCH: alert & oriented x 3, fluent speech NEURO: no focal motor/sensory  deficits  LABORATORY DATA:  I have reviewed the data as listed    Latest Ref Rng & Units 09/24/2023    9:54 AM 09/18/2023    8:49 PM 06/23/2023   10:08 AM  CBC  WBC 4.0 - 10.5 K/uL 6.1  17.3  5.6   Hemoglobin 12.0 - 15.0 g/dL 88.6  88.2  87.8   Hematocrit 36.0 - 46.0 % 31.1  31.8  32.3   Platelets 150 - 400 K/uL 286  259  263        Latest Ref Rng & Units 09/24/2023  9:54 AM 09/18/2023    8:49 PM 06/23/2023   10:08 AM  CMP  Glucose 70 - 99 mg/dL 88  850  92   BUN 8 - 23 mg/dL 16  12  17    Creatinine 0.44 - 1.00 mg/dL 9.26  9.32  9.29   Sodium 135 - 145 mmol/L 137  135  137   Potassium 3.5 - 5.1 mmol/L 4.3  3.7  4.5   Chloride 98 - 111 mmol/L 105  101  105   CO2 22 - 32 mmol/L 27  23  28    Calcium 8.9 - 10.3 mg/dL 9.1  8.8  9.1   Total Protein 6.5 - 8.1 g/dL 6.5  6.6  6.8   Total Bilirubin 0.0 - 1.2 mg/dL 1.1  1.0  1.1   Alkaline Phos 38 - 126 U/L 57  54  67   AST 15 - 41 U/L 16  19  14    ALT 0 - 44 U/L 12  14  11      RADIOGRAPHIC STUDIES: No results found.  ASSESSMENT & PLAN Miranda Holland 78 y.o. female with medical history significant for elevated ferritin of unclear etiology who presents for a follow up visit.  # Elevated Ferritin -- Genetic testing shows no evidence of hereditary hemochromatosis, however liver biopsy shows mild iron deposition. -- Given her markedly elevated ferritin levels with no other clear etiology I recommend we proceed with phlebotomy at this time. -- We will plan to target a ferritin of less than 100 with every 2 week phlebotomies. -- Labs today show white blood cell count 6.1, Hgb 11.3, MCV 99.4, Plt 286.  Ferritin level last 147 on 09/24/2023.  --We discussed with the patient that phlebotomy in this situation is controversial, however I do believe that there is minimal potential for harm in proceeding with with phlebotomy. -- Will have the patient return to clinic in 6 months. If stable at that time can extend visits to yearly with q 6 month  labs.   No orders of the defined types were placed in this encounter.   All questions were answered. The patient knows to call the clinic with any problems, questions or concerns.  A total of more than 25 minutes were spent on this encounter with face-to-face time and non-face-to-face time, including preparing to see the patient, ordering tests and/or medications, counseling the patient and coordination of care as outlined above.   Norleen IVAR Kidney, MD Department of Hematology/Oncology [facility] Phone: [phone] Pager: [phone] Email: [email or web address]  10/01/2023 7:30 AM

## 2023-10-17 ENCOUNTER — Other Ambulatory Visit: Payer: Self-pay | Admitting: Family

## 2023-10-17 DIAGNOSIS — E782 Mixed hyperlipidemia: Secondary | ICD-10-CM

## 2023-11-30 DIAGNOSIS — Z23 Encounter for immunization: Secondary | ICD-10-CM | POA: Diagnosis not present

## 2023-12-16 DIAGNOSIS — H524 Presbyopia: Secondary | ICD-10-CM | POA: Diagnosis not present

## 2023-12-16 DIAGNOSIS — Z961 Presence of intraocular lens: Secondary | ICD-10-CM | POA: Diagnosis not present

## 2023-12-16 DIAGNOSIS — H52223 Regular astigmatism, bilateral: Secondary | ICD-10-CM | POA: Diagnosis not present

## 2023-12-16 DIAGNOSIS — H26491 Other secondary cataract, right eye: Secondary | ICD-10-CM | POA: Diagnosis not present

## 2023-12-16 DIAGNOSIS — H53143 Visual discomfort, bilateral: Secondary | ICD-10-CM | POA: Diagnosis not present

## 2023-12-16 DIAGNOSIS — H353131 Nonexudative age-related macular degeneration, bilateral, early dry stage: Secondary | ICD-10-CM | POA: Diagnosis not present

## 2023-12-16 DIAGNOSIS — Z9842 Cataract extraction status, left eye: Secondary | ICD-10-CM | POA: Diagnosis not present

## 2023-12-16 DIAGNOSIS — H5203 Hypermetropia, bilateral: Secondary | ICD-10-CM | POA: Diagnosis not present

## 2023-12-25 IMAGING — MG MM DIGITAL SCREENING BILAT W/ TOMO AND CAD
8 series · 9 of 24 positions shown · non-contrast
Comparison: Previous exam(s).

CLINICAL DATA: Screening.

EXAM:
DIGITAL SCREENING BILATERAL MAMMOGRAM WITH TOMOSYNTHESIS AND CAD
TECHNIQUE: Bilateral screening digital craniocaudal and mediolateral oblique
mammograms were obtained. Bilateral screening digital breast
tomosynthesis was performed. The images were evaluated with
computer-aided detection.

[R MLO synth-2D]
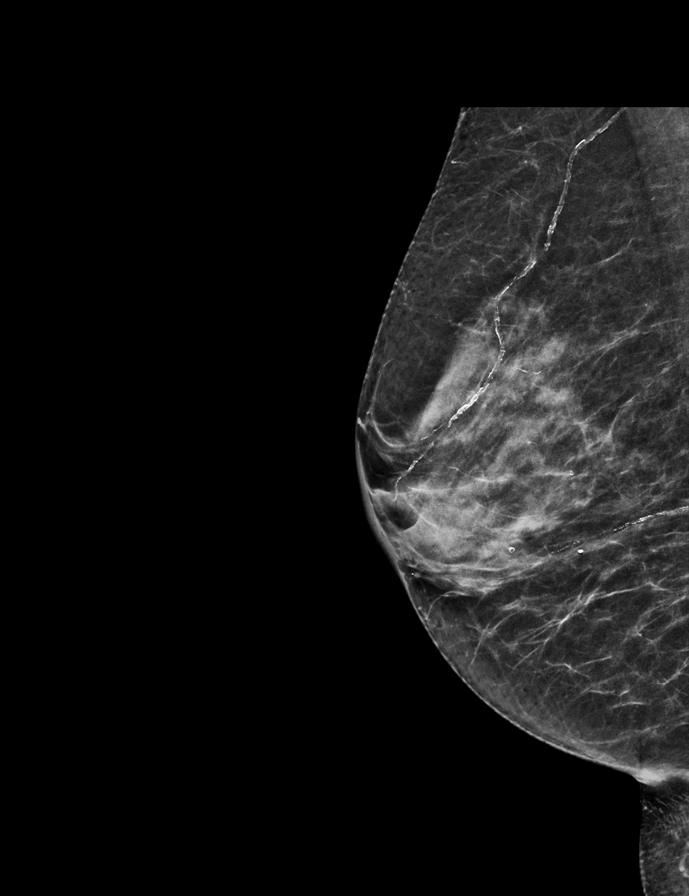

[R CC synth-2D]
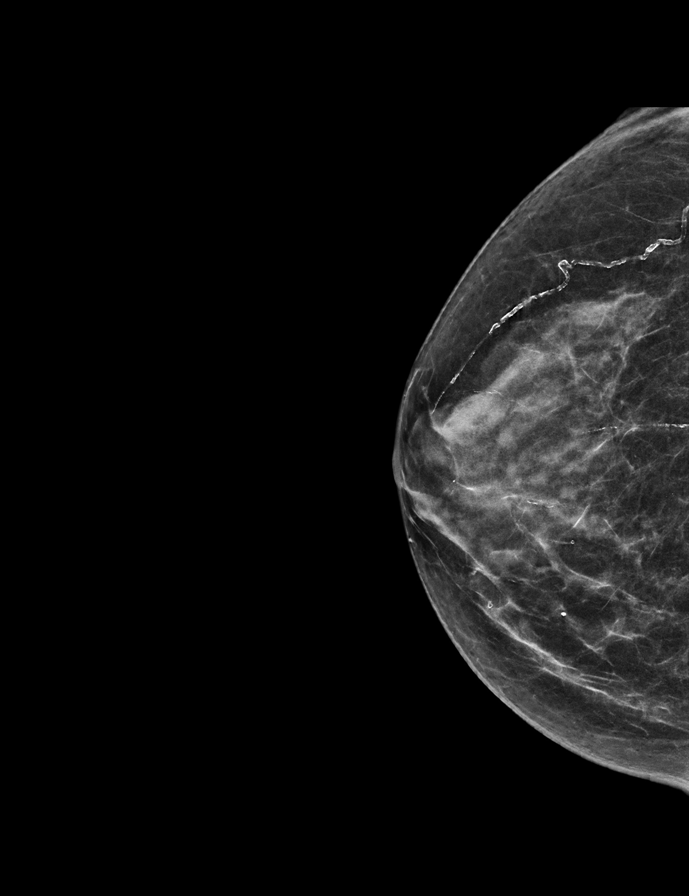

[L MLO synth-2D]
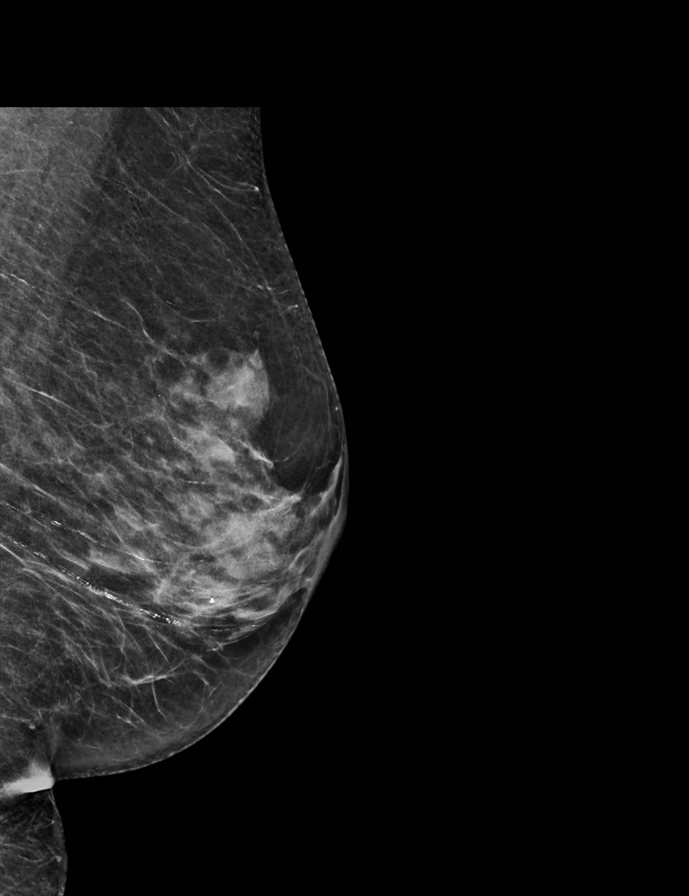

[L CC synth-2D]
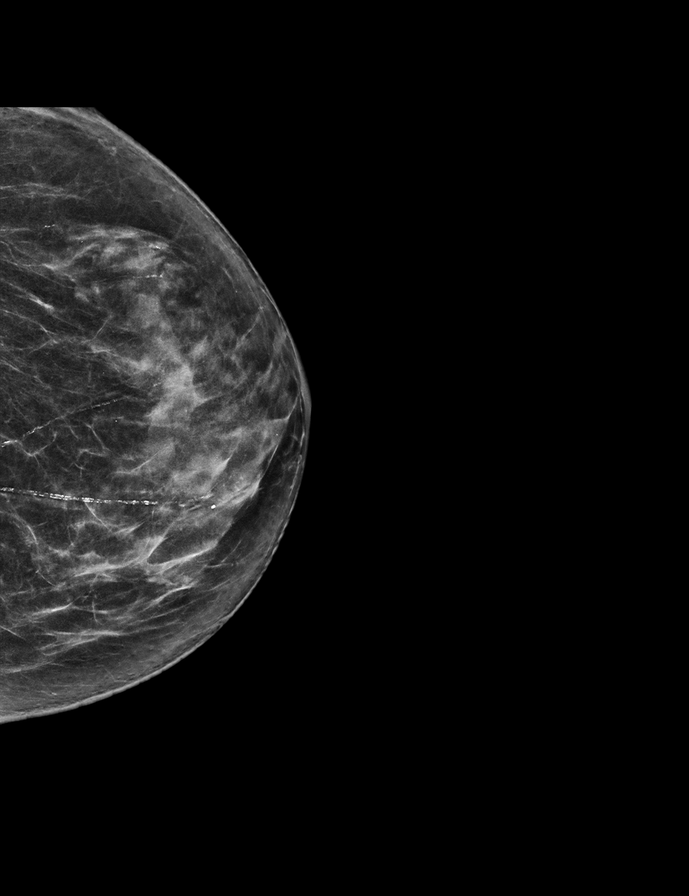

[R MLO tomo · 2 of 54 frames shown]
[frame 18/54]
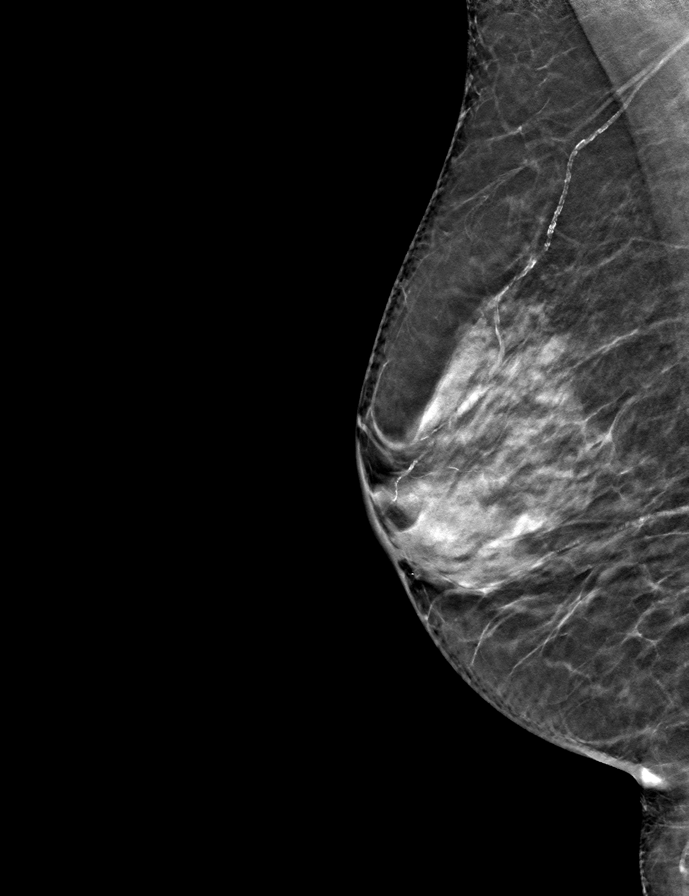
[frame 27/54]
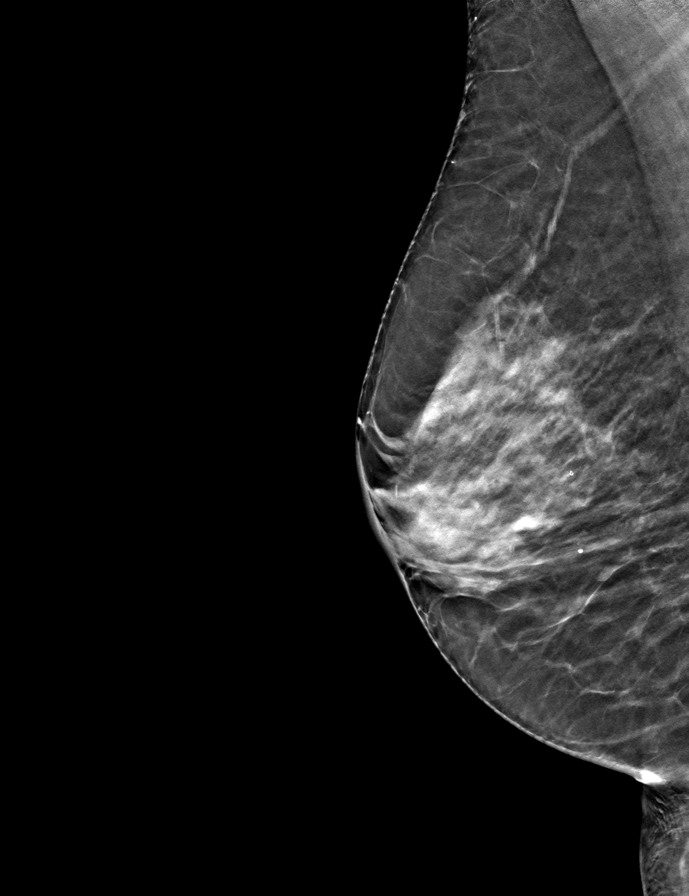

[R CC tomo · tomo slice 29/57.0]
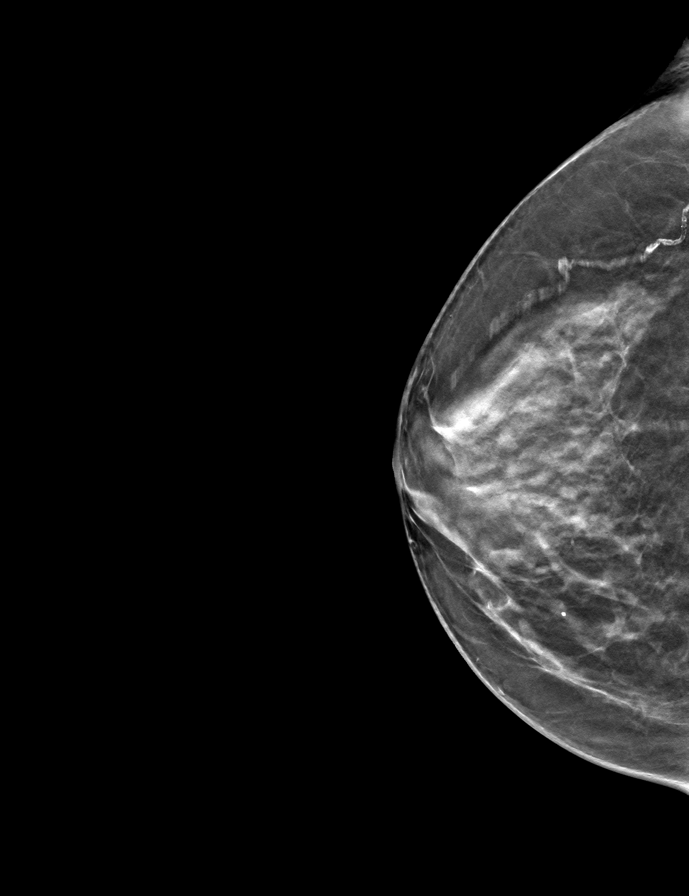

[L CC tomo · tomo slice 31/60.0]
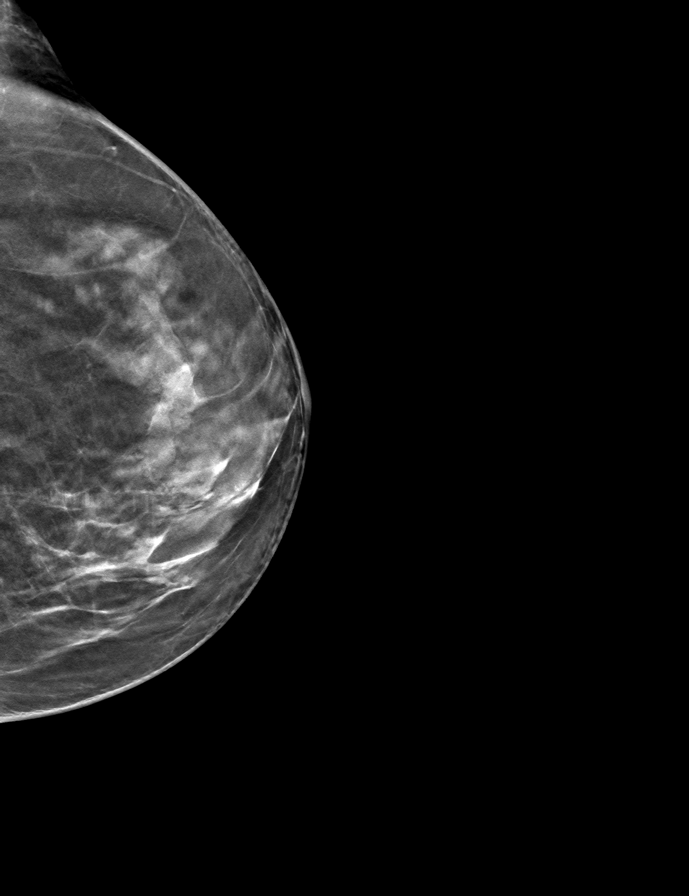

[L MLO tomo · tomo slice 29/56.0]
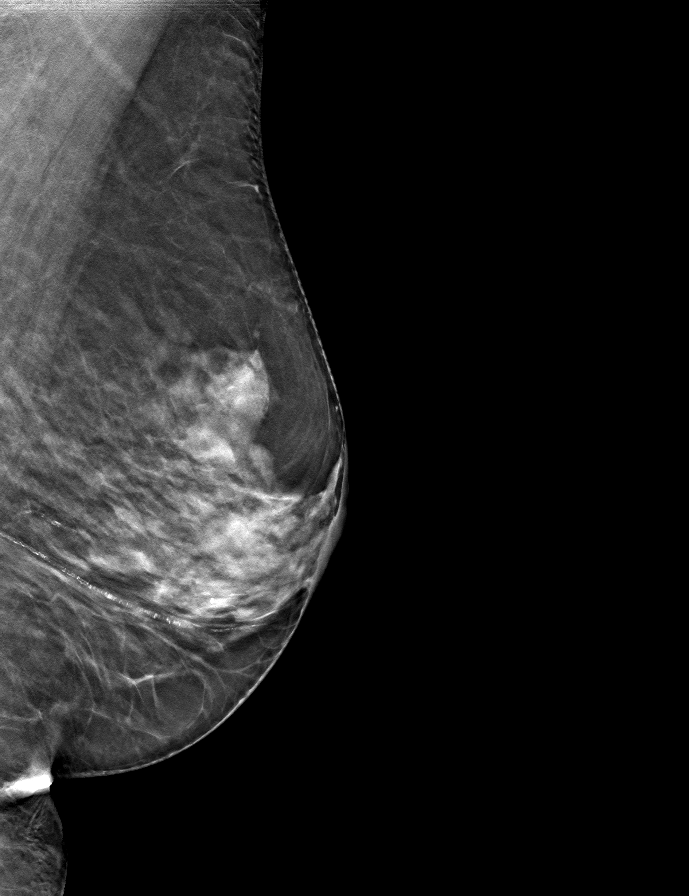

[9 of 24 positions shown; findings below may reference images not displayed]

ACR Breast Density Category c: The breast tissue is heterogeneously
dense, which may obscure small masses.
FINDINGS: There are no findings suspicious for malignancy.
IMPRESSION: No mammographic evidence of malignancy. A result letter of this
screening mammogram will be mailed directly to the patient.

RECOMMENDATION:
Screening mammogram in one year. (Code:Q3-W-BC3)

BI-RADS CATEGORY  1: Negative.

## 2024-03-30 ENCOUNTER — Other Ambulatory Visit

## 2024-04-01 ENCOUNTER — Ambulatory Visit: Admitting: Hematology and Oncology
